# Patient Record
Sex: Male | Born: 1950 | Race: Black or African American | Hispanic: No | Marital: Married | Smoking: Former smoker
Health system: Southern US, Community
[De-identification: ages and names within clinical notes are randomized; demographics above are authoritative.]

## PROBLEM LIST (undated history)

## (undated) ENCOUNTER — Emergency Department (HOSPITAL_BASED_OUTPATIENT_CLINIC_OR_DEPARTMENT_OTHER): Admission: EM | Disposition: A | Discharge status: Home / Self Care | Payer: Medicare HMO | Source: Home / Self Care

## (undated) DIAGNOSIS — J302 Other seasonal allergic rhinitis: Secondary | ICD-10-CM

## (undated) DIAGNOSIS — E119 Type 2 diabetes mellitus without complications: Secondary | ICD-10-CM

## (undated) DIAGNOSIS — I10 Essential (primary) hypertension: Secondary | ICD-10-CM

## (undated) HISTORY — PX: BACK SURGERY: SHX140

---

## 1998-12-06 ENCOUNTER — Encounter: Payer: Self-pay | Admitting: Neurosurgery

## 1998-12-07 ENCOUNTER — Encounter: Payer: Self-pay | Admitting: Neurosurgery

## 1998-12-07 ENCOUNTER — Inpatient Hospital Stay (HOSPITAL_COMMUNITY): Admission: RE | Admit: 1998-12-07 | Discharge: 1998-12-08 | Payer: Self-pay | Admitting: Neurosurgery

## 2011-08-01 DEATH — deceased

## 2016-11-28 ENCOUNTER — Emergency Department
Admission: EM | Admit: 2016-11-28 | Discharge: 2016-11-28 | Disposition: A | Discharge status: Home / Self Care | Payer: Worker's Compensation | Source: Home / Self Care | Attending: Family Medicine | Admitting: Family Medicine

## 2016-11-28 ENCOUNTER — Encounter: Payer: Self-pay | Admitting: Emergency Medicine

## 2016-11-28 DIAGNOSIS — S62639A Displaced fracture of distal phalanx of unspecified finger, initial encounter for closed fracture: Secondary | ICD-10-CM

## 2016-11-28 DIAGNOSIS — S6710XD Crushing injury of unspecified finger(s), subsequent encounter: Secondary | ICD-10-CM

## 2016-11-28 DIAGNOSIS — S61312D Laceration without foreign body of right middle finger with damage to nail, subsequent encounter: Secondary | ICD-10-CM

## 2016-11-28 HISTORY — DX: Type 2 diabetes mellitus without complications: E11.9

## 2016-11-28 MED ORDER — OXYCODONE-ACETAMINOPHEN 5-325 MG PO TABS: 1.0000 | ORAL_TABLET | Freq: Four times a day (QID) | ORAL | 0 refills | Status: DC | PRN

## 2016-11-28 NOTE — ED Triage Notes (Signed)
Right 3rd finger crush injury 3 days ago. He is concerned that it is still bleeding and still hurts. Also the medication is making him nauseated, but he is unsure which one.

## 2016-11-28 NOTE — ED Provider Notes (Signed)
[facility]    CSN: [id number] Arrival date & time: 11/28/16  0850     History   Chief Complaint Chief Complaint  Patient presents with  . Finger Injury    HPI Hayden Kemp is a 66 y.o. male.   Patient suffered a crush injury to his right third fingertip 3 days ago.  He was [facility] where X-rays revealed a comminuted fracture of the right distal phalanx with intra-articular extension.  A laceration at the base of the fingernail was repaired with sutures.  He complains of persistent pain in the finger and difficulty performing his job.  He is also having nausea from one of his prescribed medications (Keflex 500mg  QID, and Norco Q6hr prn pain)   The history is provided by the patient.    Past Medical History:  Diagnosis Date  . Diabetes mellitus without complication (HCC)     There are no active problems to display for this patient.   Past Surgical History:  Procedure Laterality Date  . BACK SURGERY         Home Medications    Prior to Admission medications   Medication Sig Start Date End Date Taking? Authorizing Provider  ciprofloxacin (CIPRO) 500 MG tablet Take 500 mg by mouth 2 (two) times daily.   Yes Historical Provider, MD  oxyCODONE-acetaminophen (ROXICET) 5-325 MG tablet Take 1 tablet by mouth every 6 (six) hours as needed for severe pain. 11/28/16   Lattie HawStephen A Marysue Fait, MD    Family History No family history on file.  Social History Social History  Substance Use Topics  . Smoking status: Former Games developermoker  . Smokeless tobacco: Never Used  . Alcohol use Yes     Allergies   Patient has no known allergies.   Review of Systems Review of Systems  Constitutional: Negative for appetite change, chills, diaphoresis, fatigue and fever.  Gastrointestinal: Positive for nausea. Negative for abdominal pain and vomiting.  Musculoskeletal:       Pain in right third fingertip     Physical  Exam Triage Vital Signs ED Triage Vitals  Enc Vitals Group     BP 11/28/16 0952 138/86     Pulse Rate 11/28/16 0952 79     Resp --      Temp 11/28/16 0952 97.7 F (36.5 C)     Temp Source 11/28/16 0952 Oral     SpO2 11/28/16 0952 97 %     Weight 11/28/16 0953 250 lb (113.4 kg)     Height 11/28/16 0953 6' (1.829 m)     Head Circumference --      Peak Flow --      Pain Score 11/28/16 0955 8     Pain Loc --      Pain Edu? --      Excl. in GC? --    No data found.   Updated Vital Signs BP 138/86 (BP Location: Left Arm)   Pulse 79   Temp 97.7 F (36.5 C) (Oral)   Ht 6' (1.829 m)   Wt 250 lb (113.4 kg)   SpO2 97%   BMI 33.91 kg/m   Visual Acuity Right Eye Distance:   Left Eye Distance:   Bilateral Distance:    Right Eye Near:   Left Eye Near:    Bilateral Near:     Physical Exam  Constitutional: He appears well-developed and well-nourished. No distress.  HENT:  Head: Normocephalic.  Eyes: Pupils are equal,  round, and reactive to light.  Pulmonary/Chest: Effort normal.  Musculoskeletal:       Hands: Right third fingertip distal phalanx mildly swollen with tenderness to palpation at DIP joint.  No erythema or warmth. Decreased range of motion DIP joint.  Sutures in place at base of nail.  Small amount subungual hematoma present.  Distal cap refill present.  No evidence cellulitis.  Neurological: He is alert.  Skin: Skin is warm and dry.  Nursing note and vitals reviewed.    UC Treatments / Results  Labs (all labs ordered are listed, but only abnormal results are displayed) Labs Reviewed - No data to display  EKG  EKG Interpretation None       Radiology No results found.  Procedures Procedures (including critical care time)  Medications Ordered in UC Medications - No data to display   Initial Impression / Assessment and Plan / UC Course  I have reviewed the triage vital signs and the nursing notes.  Pertinent labs & imaging results that were  available during my care of the patient were reviewed by me and considered in my medical decision making (see chart for details).  Clinical Course   There is no evidence of bacterial infection today.  Wound redressed with Bacitracin and non-stick gauze.  Applied stack splint. Rx for Percocet. Continue finger splint.  Change dressing daily and apply Bacitracin ointment to wound.  Keep wound clean and dry.  Return for any signs of infection (or follow-up with family doctor):  Increasing redness, swelling, pain, heat, drainage, etc. Decrease Keflex 500mg  to every 8 hours.  Stop Norco.  May take Ibuprofen 200mg , 3 or 4 tabs every 8 hours with food.   Refer to orthopedist (note fracture distal phalanx with intra-articular extension).  Work restrictions:  Avoid use of right hand (light duty) until released by orthopedist.     Final Clinical Impressions(s) / UC Diagnoses   Final diagnoses:  Crush injury to finger, subsequent encounter  Fracture of distal phalanx of finger of right hand  Laceration of right middle finger without foreign body with damage to nail, subsequent encounter    New Prescriptions New Prescriptions   OXYCODONE-ACETAMINOPHEN (ROXICET) 5-325 MG TABLET    Take 1 tablet by mouth every 6 (six) hours as needed for severe pain.     Lattie HawStephen A Valaree Fresquez, MD 12/06/16 [phone]

## 2016-11-28 NOTE — Discharge Instructions (Signed)
Continue finger splint.  Change dressing daily and apply Bacitracin ointment to wound.  Keep wound clean and dry.  Return for any signs of infection (or follow-up with family doctor):  Increasing redness, swelling, pain, heat, drainage, etc. Continue Keflex 500mg  every 8 hours.  Stop Norco.  May take Ibuprofen 200mg , 3 or 4 tabs every 8 hours with food.

## 2016-12-03 ENCOUNTER — Encounter (HOSPITAL_BASED_OUTPATIENT_CLINIC_OR_DEPARTMENT_OTHER): Payer: Self-pay | Admitting: *Deleted

## 2016-12-03 ENCOUNTER — Other Ambulatory Visit: Payer: Self-pay | Admitting: Orthopedic Surgery

## 2016-12-03 NOTE — H&P (Signed)
Roger ShelterJames A Fossum is an 66 y.o. male.   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  [facility] and then in employee health.  In the emergency department he was x-rayed and splinted, and some kind of repair was performed involving the nail complex.  He was placed on Keflex as well as ibuprofen and Percocet.  Patient reports that he is just about out of pain medicine.  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  Past Medical History:  Diagnosis Date  . Diabetes mellitus without complication (HCC)   . Hypertension   . Seasonal allergies     Past Surgical History:  Procedure Laterality Date  . BACK SURGERY      History reviewed. No pertinent family history. Social History:  reports that he has quit smoking. He has never used smokeless tobacco. He reports that he drinks alcohol. He reports that he does not use drugs.  Allergies: No Known Allergies  No prescriptions prior to admission.    No results found for this or any previous visit (from the past 48 hour(s)). No results found.  Review of Systems  All other systems reviewed and are negative.   Height 6' (1.829 m), weight 117 kg (258 lb). Physical Exam  Constitutional:  WD, WN, NAD HEENT:  NCAT, EOMI Neuro/Psych:  Alert & oriented to person, place, and time; appropriate mood & affect Lymphatic: No generalized UE edema or lymphadenopathy Extremities / MSK:  Both UE are normal with respect to appearance, ranges of motion, joint stability, muscle strength/tone, sensation, & perfusion except as otherwise noted:  The right long fingertip has a laceration at the eponychial fold.  The digit has approximately 3 sutures through the nail and potentially into the nail bed which do not appear to  be infected.  There is definite discoloration and what appears to be a subungual hematoma.  The underlying nailbed cannot be visualized. The skin about the distal phalanx is also macerated as the patient has been applying ointment.  The PIP and MP are able to flex fully, but the digit itself is quite swollen.  Light touch sensibility is intact although somewhat altered at the digital fingertip.  Labs / Xrays:  3 views of the right long finger were ordered and obtained today and demonstrate a very comminuted,  displaced, intra-articular, distal phalanx fracture.  Assessment: Right long finger crush injury with a comminuted, displaced distal phalanx fracture with intra-articular extension and unclear degree of injury to the nail bed as the nail plate has been replaced/repaired  Plan:  The findings are discussed with the patient as well as his nurse case manager.  The advantages and disadvantages of continued nonoperative care versus surgical care were discussed.  The patient would like to move forward with surgical treatment and will do so tomorrow afternoon, removing the nail plate, exploring and repairing the nail complex as necessary, and hopefully improving upon the alignment/stability of the distal phalangeal fracture fragments to some degree. The details of the operative procedure were discussed with the patient.  Questions were invited and answered.  In addition to the goal of the procedure, the risks of the procedure to include but not limited to bleeding; infection; damage to the nerves or blood vessels that could  result in bleeding, numbness, weakness, chronic pain, and the need for additional procedures; stiffness; the need for revision surgery; and anesthetic risks were reviewed.  No specific outcome was guaranteed or implied.  Informed consent was obtained.   Manilla Strieter A., MD 12/03/2016, 7:28 PM

## 2016-12-04 ENCOUNTER — Encounter (HOSPITAL_BASED_OUTPATIENT_CLINIC_OR_DEPARTMENT_OTHER): Payer: Self-pay | Admitting: *Deleted

## 2016-12-04 ENCOUNTER — Ambulatory Visit (HOSPITAL_BASED_OUTPATIENT_CLINIC_OR_DEPARTMENT_OTHER): Payer: Worker's Compensation | Admitting: Anesthesiology

## 2016-12-04 ENCOUNTER — Encounter (HOSPITAL_BASED_OUTPATIENT_CLINIC_OR_DEPARTMENT_OTHER)
Admission: RE | Disposition: A | Discharge status: Home / Self Care | Payer: Self-pay | Source: Ambulatory Visit | Attending: Orthopedic Surgery

## 2016-12-04 ENCOUNTER — Ambulatory Visit (HOSPITAL_COMMUNITY): Payer: Worker's Compensation

## 2016-12-04 ENCOUNTER — Ambulatory Visit (HOSPITAL_BASED_OUTPATIENT_CLINIC_OR_DEPARTMENT_OTHER)
Admission: RE | Admit: 2016-12-04 | Discharge: 2016-12-04 | Disposition: A | Discharge status: Home / Self Care | Payer: Worker's Compensation | Source: Ambulatory Visit | Attending: Orthopedic Surgery | Admitting: Orthopedic Surgery

## 2016-12-04 DIAGNOSIS — S67192A Crushing injury of right middle finger, initial encounter: Secondary | ICD-10-CM | POA: Diagnosis present

## 2016-12-04 DIAGNOSIS — W230XXA Caught, crushed, jammed, or pinched between moving objects, initial encounter: Secondary | ICD-10-CM | POA: Diagnosis not present

## 2016-12-04 DIAGNOSIS — S62632B Displaced fracture of distal phalanx of right middle finger, initial encounter for open fracture: Secondary | ICD-10-CM | POA: Diagnosis not present

## 2016-12-04 DIAGNOSIS — Y9289 Other specified places as the place of occurrence of the external cause: Secondary | ICD-10-CM | POA: Diagnosis not present

## 2016-12-04 DIAGNOSIS — Z87891 Personal history of nicotine dependence: Secondary | ICD-10-CM | POA: Insufficient documentation

## 2016-12-04 DIAGNOSIS — E119 Type 2 diabetes mellitus without complications: Secondary | ICD-10-CM | POA: Insufficient documentation

## 2016-12-04 DIAGNOSIS — Z419 Encounter for procedure for purposes other than remedying health state, unspecified: Secondary | ICD-10-CM

## 2016-12-04 DIAGNOSIS — I10 Essential (primary) hypertension: Secondary | ICD-10-CM | POA: Diagnosis not present

## 2016-12-04 HISTORY — PX: NAILBED REPAIR: SHX5028

## 2016-12-04 HISTORY — PX: OPEN REDUCTION INTERNAL FIXATION (ORIF) DISTAL PHALANX: SHX6236

## 2016-12-04 HISTORY — DX: Other seasonal allergic rhinitis: J30.2

## 2016-12-04 HISTORY — DX: Essential (primary) hypertension: I10

## 2016-12-04 LAB — POCT I-STAT, CHEM 8
BUN: 17 mg/dL (ref 6–20)
CALCIUM ION: 1.21 mmol/L (ref 1.15–1.40)
CHLORIDE: 103 mmol/L (ref 101–111)
CREATININE: 1 mg/dL (ref 0.61–1.24)
Glucose, Bld: 149 mg/dL — ABNORMAL HIGH (ref 65–99)
HCT: 50 % (ref 39.0–52.0)
Hemoglobin: 17 g/dL (ref 13.0–17.0)
Potassium: 4.4 mmol/L (ref 3.5–5.1)
Sodium: 140 mmol/L (ref 135–145)
TCO2: 26 mmol/L (ref 0–100)

## 2016-12-04 LAB — GLUCOSE, CAPILLARY: Glucose-Capillary: 119 mg/dL — ABNORMAL HIGH (ref 65–99)

## 2016-12-04 SURGERY — OPEN REDUCTION INTERNAL FIXATION (ORIF) DISTAL PHALANX
Anesthesia: Monitor Anesthesia Care | Site: Finger | Laterality: Right

## 2016-12-04 MED ORDER — BUPIVACAINE-EPINEPHRINE 0.25% -1:200000 IJ SOLN
INTRAMUSCULAR | Status: DC | PRN
  Administered 2016-12-04: 5 mL

## 2016-12-04 MED ORDER — SCOPOLAMINE 1 MG/3DAYS TD PT72: 1.0000 | MEDICATED_PATCH | Freq: Once | TRANSDERMAL | Status: DC | PRN

## 2016-12-04 MED ORDER — LACTATED RINGERS IV SOLN
INTRAVENOUS | Status: DC
  Administered 2016-12-04: 15:00:00 via INTRAVENOUS

## 2016-12-04 MED ORDER — CEFAZOLIN SODIUM-DEXTROSE 2-4 GM/100ML-% IV SOLN
2.0000 g | INTRAVENOUS | Status: AC
  Administered 2016-12-04: 2 g via INTRAVENOUS

## 2016-12-04 MED ORDER — FENTANYL CITRATE (PF) 100 MCG/2ML IJ SOLN
50.0000 ug | INTRAMUSCULAR | Status: DC | PRN
  Administered 2016-12-04 (×2): 50 ug via INTRAVENOUS

## 2016-12-04 MED ORDER — ONDANSETRON HCL 4 MG/2ML IJ SOLN
INTRAMUSCULAR | Status: DC | PRN
  Administered 2016-12-04: 4 mg via INTRAVENOUS

## 2016-12-04 MED ORDER — LIDOCAINE 2% (20 MG/ML) 5 ML SYRINGE
INTRAMUSCULAR | Status: AC
  Filled 2016-12-04: qty 5

## 2016-12-04 MED ORDER — MIDAZOLAM HCL 2 MG/2ML IJ SOLN
1.0000 mg | INTRAMUSCULAR | Status: DC | PRN
  Administered 2016-12-04: 1 mg via INTRAVENOUS

## 2016-12-04 MED ORDER — PROPOFOL 10 MG/ML IV BOLUS
INTRAVENOUS | Status: AC
  Filled 2016-12-04: qty 20

## 2016-12-04 MED ORDER — ONDANSETRON HCL 4 MG/2ML IJ SOLN
INTRAMUSCULAR | Status: AC
  Filled 2016-12-04: qty 2

## 2016-12-04 MED ORDER — PROPOFOL 500 MG/50ML IV EMUL
INTRAVENOUS | Status: DC | PRN
  Administered 2016-12-04: 100 ug/kg/min via INTRAVENOUS

## 2016-12-04 MED ORDER — LACTATED RINGERS IV SOLN
INTRAVENOUS | Status: DC
  Administered 2016-12-04: 14:00:00 via INTRAVENOUS

## 2016-12-04 MED ORDER — MIDAZOLAM HCL 2 MG/2ML IJ SOLN
INTRAMUSCULAR | Status: AC
  Filled 2016-12-04: qty 2

## 2016-12-04 MED ORDER — BUPIVACAINE-EPINEPHRINE 0.5% -1:200000 IJ SOLN: INTRAMUSCULAR | Status: DC | PRN

## 2016-12-04 MED ORDER — FENTANYL CITRATE (PF) 100 MCG/2ML IJ SOLN
INTRAMUSCULAR | Status: AC
  Filled 2016-12-04: qty 2

## 2016-12-04 MED ORDER — LIDOCAINE HCL (PF) 1 % IJ SOLN
INTRAMUSCULAR | Status: DC | PRN
  Administered 2016-12-04: 5 mL

## 2016-12-04 MED ORDER — CEFAZOLIN SODIUM-DEXTROSE 2-4 GM/100ML-% IV SOLN
INTRAVENOUS | Status: AC
Start: 2016-12-04 — End: 2016-12-04
  Filled 2016-12-04: qty 100

## 2016-12-04 SURGICAL SUPPLY — 50 items
BANDAGE COBAN STERILE 2 (GAUZE/BANDAGES/DRESSINGS) IMPLANT
BLADE MINI RND TIP GREEN BEAV (BLADE) IMPLANT
BLADE SURG 15 STRL LF DISP TIS (BLADE) ×1 IMPLANT
BLADE SURG 15 STRL SS (BLADE) ×2
BNDG COHESIVE 1X5 TAN STRL LF (GAUZE/BANDAGES/DRESSINGS) IMPLANT
BNDG COHESIVE 4X5 TAN STRL (GAUZE/BANDAGES/DRESSINGS) ×3 IMPLANT
BNDG CONFORM 2 STRL LF (GAUZE/BANDAGES/DRESSINGS) IMPLANT
BNDG ESMARK 4X9 LF (GAUZE/BANDAGES/DRESSINGS) ×3 IMPLANT
BNDG GAUZE ELAST 4 BULKY (GAUZE/BANDAGES/DRESSINGS) ×3 IMPLANT
CHLORAPREP W/TINT 26ML (MISCELLANEOUS) ×3 IMPLANT
CORDS BIPOLAR (ELECTRODE) ×3 IMPLANT
COVER BACK TABLE 60X90IN (DRAPES) ×3 IMPLANT
COVER MAYO STAND STRL (DRAPES) ×3 IMPLANT
CUFF TOURNIQUET SINGLE 18IN (TOURNIQUET CUFF) ×3 IMPLANT
DEPRESSOR TONGUE BLADE STERILE (MISCELLANEOUS) IMPLANT
DRAPE C-ARM 42X72 X-RAY (DRAPES) ×3 IMPLANT
DRAPE EXTREMITY T 121X128X90 (DRAPE) ×3 IMPLANT
DRAPE SURG 17X23 STRL (DRAPES) ×3 IMPLANT
DRSG EMULSION OIL 3X3 NADH (GAUZE/BANDAGES/DRESSINGS) ×3 IMPLANT
GLOVE BIO SURGEON STRL SZ 6.5 (GLOVE) ×2 IMPLANT
GLOVE BIO SURGEON STRL SZ7.5 (GLOVE) ×3 IMPLANT
GLOVE BIO SURGEONS STRL SZ 6.5 (GLOVE) ×1
GLOVE BIOGEL PI IND STRL 7.0 (GLOVE) ×2 IMPLANT
GLOVE BIOGEL PI IND STRL 8 (GLOVE) ×1 IMPLANT
GLOVE BIOGEL PI INDICATOR 7.0 (GLOVE) ×4
GLOVE BIOGEL PI INDICATOR 8 (GLOVE) ×2
GLOVE ECLIPSE 6.5 STRL STRAW (GLOVE) ×3 IMPLANT
GOWN STRL REUS W/ TWL LRG LVL3 (GOWN DISPOSABLE) ×2 IMPLANT
GOWN STRL REUS W/TWL LRG LVL3 (GOWN DISPOSABLE) ×4
GOWN STRL REUS W/TWL XL LVL3 (GOWN DISPOSABLE) ×3 IMPLANT
K-WIRE .035X4 (WIRE) ×3 IMPLANT
K-WIRE .045X4 (WIRE) ×6 IMPLANT
NEEDLE HYPO 22GX1.5 SAFETY (NEEDLE) IMPLANT
NS IRRIG 1000ML POUR BTL (IV SOLUTION) ×3 IMPLANT
PACK BASIN DAY SURGERY FS (CUSTOM PROCEDURE TRAY) ×3 IMPLANT
PADDING CAST ABS 4INX4YD NS (CAST SUPPLIES)
PADDING CAST ABS COTTON 4X4 ST (CAST SUPPLIES) IMPLANT
RUBBERBAND STERILE (MISCELLANEOUS) IMPLANT
SPONGE GAUZE 4X4 12PLY STER LF (GAUZE/BANDAGES/DRESSINGS) ×3 IMPLANT
STOCKINETTE 6  STRL (DRAPES) ×2
STOCKINETTE 6 STRL (DRAPES) ×1 IMPLANT
SUT CHROMIC 6 0 PS 4 (SUTURE) ×3 IMPLANT
SUT ETHILON 4 0 PS 2 18 (SUTURE) ×3 IMPLANT
SUT VICRYL RAPIDE 4-0 (SUTURE) IMPLANT
SUT VICRYL RAPIDE 4/0 PS 2 (SUTURE) IMPLANT
SYR 10ML LL (SYRINGE) IMPLANT
SYR BULB 3OZ (MISCELLANEOUS) IMPLANT
TOWEL OR 17X24 6PK STRL BLUE (TOWEL DISPOSABLE) ×3 IMPLANT
TOWEL OR NON WOVEN STRL DISP B (DISPOSABLE) ×3 IMPLANT
UNDERPAD 30X30 (UNDERPADS AND DIAPERS) ×3 IMPLANT

## 2016-12-04 NOTE — Discharge Instructions (Signed)
Discharge Instructions   You have a dressing with a plaster splint incorporated in it. Move your fingers as much as possible, making a full fist and fully opening the fist. Elevate your hand above your elbow to reduce pain & swelling of the digits.  Ice over the operative site and/or the arm pit may be helpful to reduce pain & swelling.  DO NOT USE HEAT. Pain medicine has been prescribed for you.  Take the meloxicam as it states on the bottle. You may add Tylenol and continue this also as it states on the bottle. The pain medication is for severe break through pain. Leave the dressing in place until you return to our office.  You may shower, but keep the bandage clean & dry.  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 Our office will contact you in 7-10 days for follow up appointment. Out of work until the return appointment in 7-10 days.   Please call [phone] during normal business hours or [phone] after hours for any problems. Including the following:  - excessive redness of the incisions - drainage for more than 4 days - fever of more than 101.5 F  *Please note that pain medications will not be refilled after hours or on weekends.    Post Anesthesia Home Care Instructions  Activity: Get plenty of rest for the remainder of the day. A responsible adult should stay with you for 24 hours following the procedure.  For the next 24 hours, DO NOT: -Drive a car -Advertising copywriterperate machinery -Drink alcoholic beverages -Take any medication unless instructed by your physician -Make any legal decisions or sign important papers.  Meals: Start with liquid foods such as gelatin or soup. Progress to regular foods as tolerated. Avoid greasy, spicy, heavy foods. If nausea and/or vomiting occur, drink only clear liquids until the nausea and/or vomiting subsides. Call your physician if vomiting continues.  Special  Instructions/Symptoms: Your throat may feel dry or sore from the anesthesia or the breathing tube placed in your throat during surgery. If this causes discomfort, gargle with warm salt water. The discomfort should disappear within 24 hours.  If you had a scopolamine patch placed behind your ear for the management of post- operative nausea and/or vomiting:  1. The medication in the patch is effective for 72 hours, after which it should be removed.  Wrap patch in a tissue and discard in the trash. Wash hands thoroughly with soap and water. 2. You may remove the patch earlier than 72 hours if you experience unpleasant side effects which may include dry mouth, dizziness or visual disturbances. 3. Avoid touching the patch. Wash your hands with soap and water after contact with the patch.

## 2016-12-04 NOTE — Anesthesia Postprocedure Evaluation (Signed)
Anesthesia Post Note  Patient: Roger ShelterJames A Quigley  Procedure(s) Performed: Procedure(s) (LRB): OPEN TREATMENT OF RIGHT LONG FINGER  DISTAL PHALANX FRACTURE AND NAILBED INJURY (Right) NAILBED REPAIR (Right)  Patient location during evaluation: PACU Anesthesia Type: MAC Level of consciousness: awake and alert Pain management: pain level controlled Vital Signs Assessment: post-procedure vital signs reviewed and stable Respiratory status: spontaneous breathing, nonlabored ventilation, respiratory function stable and patient connected to nasal cannula oxygen Cardiovascular status: stable and blood pressure returned to baseline Anesthetic complications: no    Last Vitals:  Vitals:   12/04/16 1341 12/04/16 1615  BP: (!) 159/84   Pulse: 70 66  Resp: 18 (!) 9  Temp: 36.6 C 36.6 C    Last Pain:  Vitals:   12/04/16 1615  TempSrc:   PainSc: 0-No pain                 Ziggy Reveles DAVID

## 2016-12-04 NOTE — Interval H&P Note (Signed)
History and Physical Interval Note:  12/04/2016 2:23 PM  Hayden Kemp  has presented today for surgery, with the diagnosis of RIGHT LONG FINGER DISTAL PHALANX FRACTURE AND NAILBED INJURY S62.632A  The various methods of treatment have been discussed with the patient and family. After consideration of risks, benefits and other options for treatment, the patient has consented to  Procedure(s): OPEN TREATMENT OF RIGHT LONG FINGER  DISTAL PHALANX FRACTURE AND NAILBED INJURY (Right) as a surgical intervention .  The patient's history has been reviewed, patient examined, no change in status, stable for surgery.  I have reviewed the patient's chart and labs.  Questions were answered to the patient's satisfaction.     Robertha Staples A.

## 2016-12-04 NOTE — Anesthesia Preprocedure Evaluation (Signed)
Anesthesia Evaluation  Patient identified by MRN, date of birth, ID band Patient awake    Reviewed: Allergy & Precautions, NPO status , Patient's Chart, lab work & pertinent test results  Airway Mallampati: I  TM Distance: >3 FB Neck ROM: Full    Dental   Pulmonary former smoker,    Pulmonary exam normal        Cardiovascular hypertension, Pt. on medications Normal cardiovascular exam     Neuro/Psych    GI/Hepatic   Endo/Other  diabetes, Type 2, Oral Hypoglycemic Agents  Renal/GU      Musculoskeletal   Abdominal   Peds  Hematology   Anesthesia Other Findings   Reproductive/Obstetrics                             Anesthesia Physical Anesthesia Plan  ASA: II  Anesthesia Plan: MAC   Post-op Pain Management:    Induction: Intravenous  Airway Management Planned: Simple Face Mask  Additional Equipment:   Intra-op Plan:   Post-operative Plan:   Informed Consent: I have reviewed the patients History and Physical, chart, labs and discussed the procedure including the risks, benefits and alternatives for the proposed anesthesia with the patient or authorized representative who has indicated his/her understanding and acceptance.     Plan Discussed with: CRNA and Surgeon  Anesthesia Plan Comments:         Anesthesia Quick Evaluation

## 2016-12-04 NOTE — Op Note (Signed)
12/04/2016  2:23 PM  PATIENT:  Hayden Kemp  66 y.o. male  PRE-OPERATIVE DIAGNOSIS:  Comminuted displaced right long finger distal phalanx fracture with suspected nailbed injury  POST-OPERATIVE DIAGNOSIS:  Same  PROCEDURE:  Excisional debridement of skin and subcutaneous tissue associated with open fracture, with ORIF right long finger P3 fracture with nail plate removal and nailbed repair  SURGEON: Cliffton Astersavid A. Janee Mornhompson, MD  PHYSICIAN ASSISTANT: Danielle RankinKirsten Schrader, OPA-C  ANESTHESIA:  local and MAC  SPECIMENS:  None  DRAINS:   None  EBL:  less than 50 mL  PREOPERATIVE INDICATIONS:  Hayden Kemp is a  66 y.o. male with crush injury to right long finger, causing a comminuted displaced P3 fracture with overlying nailbed injury  The risks benefits and alternatives were discussed with the patient preoperatively including but not limited to the risks of infection, bleeding, nerve injury, cardiopulmonary complications, the need for revision surgery, among others, and the patient verbalized understanding and consented to proceed.  OPERATIVE IMPLANTS: 0.045 inch K wires 2, 0.035 inch K wires 1  OPERATIVE PROCEDURE:  After receiving prophylactic antibiotics, the patient was escorted to the operative theatre and placed in a supine position.  A surgical "time-out" was performed during which the planned procedure, proposed operative site, and the correct patient identity were compared to the operative consent and agreement confirmed by the circulating nurse according to current facility policy.  He was sedated and I instilled a digital block with a mixture of lidocaine and Marcaine bearing epinephrine.  Following application of a tourniquet to the operative extremity, the exposed skin was pre-scrub with a Hibiclens scrub brush before being formally prepped with Chloraprep and draped in the usual sterile fashion.  The limb was exsanguinated with an Esmarch bandage and the tourniquet inflated to  approximately 100mmHg higher than systolic BP.  The  nail plate was removed by separating it from the areas where it was still attached nailbed with a Therapist, nutritionalreer elevator and hemostat.  Once the nail plate was removed, it could be seen that there was a complete transverse disruption of the nailbed right at the edge of the eponychial fold.  The eponychial fold was somewhat devitalized and so skin and subcutaneous tissues were excisionally debrided with scissor dissection.  The devitalized portions of nailbed were excised with sharp dissection using a scalpel and forceps.  The open fracture was then irrigated copiously.  Once satisfied with the excision of devitalized tissue and irrigation of fracture, fixation commenced.  Manipulate fragments with visualization and direct fragment manipulation using my fingers, the fracture fragments were better aligned and secured with 2 crossed longitudinal 0.045 inch K wires and one distal transverse or 0.035 inch K wire.  The longitudinal K wires crossed the DIP joint.  Alignment of the fracture fragments was much improved.  There was still some displacement in the dorsal cortical shell piece at the proximal diametaphyseal junction is it was a loose fragment.  After final images, I did elevated some and then compressed longitudinally to help secured in a more dorsally located position.  These were subsequently bent over at the skin and clipped.  The nailbed was repaired with 6-0 chromic suture in interrupted fashion.  Xeroform was placed on the nailbed and also in the nail fold.  Tourniquet was released, the digit was dressed with a volar tongue blade component still allow the MP and PIP to flex.  He was taken to recovery room stable condition  DISPOSITION: He will be discharged home today,  returning in 7-10 days with follow-on hand therapy appointment for custom splint fabrication and initiation of motion exercises.

## 2016-12-04 NOTE — Transfer of Care (Signed)
Immediate Anesthesia Transfer of Care Note  Patient: Hayden Kemp  Procedure(s) Performed: Procedure(s): OPEN TREATMENT OF RIGHT LONG FINGER  DISTAL PHALANX FRACTURE AND NAILBED INJURY (Right) NAILBED REPAIR (Right)  Patient Location: PACU  Anesthesia Type:MAC  Level of Consciousness: awake and sedated  Airway & Oxygen Therapy: Patient Spontanous Breathing  Post-op Assessment: Report given to RN and Post -op Vital signs reviewed and stable  Post vital signs: Reviewed and stable  Last Vitals:  Vitals:   12/04/16 1341  BP: (!) 159/84  Pulse: 70  Resp: 18  Temp: 36.6 C    Last Pain:  Vitals:   12/04/16 1341  TempSrc: Oral  PainSc: 3       Patients Stated Pain Goal: 1 (12/04/16 1341)  Complications: No apparent anesthesia complications

## 2016-12-05 ENCOUNTER — Encounter (HOSPITAL_BASED_OUTPATIENT_CLINIC_OR_DEPARTMENT_OTHER): Payer: Self-pay | Admitting: Orthopedic Surgery

## 2017-04-22 ENCOUNTER — Other Ambulatory Visit: Payer: Self-pay | Admitting: Orthopedic Surgery

## 2017-04-24 ENCOUNTER — Encounter (HOSPITAL_BASED_OUTPATIENT_CLINIC_OR_DEPARTMENT_OTHER): Payer: Self-pay | Admitting: *Deleted

## 2017-04-24 NOTE — H&P (Signed)
Hayden Kemp is an 67 y.o. male.   CC / Reason for Visit: Right long finger follow-up HPI: This patient returns for reevaluation of his right long finger.  He reports that he continues to use the Silipos device and a rigid splint intermittently.  He still has pain with some gripping and grasping activities, more so when pressure is applied to the base of the distal phalanx volarly than at the very tip.  He isn't really bothered much by the gross instability through the distal phalanx  Past Medical History:  Diagnosis Date  . Diabetes mellitus without complication (HCC)   . Hypertension   . Seasonal allergies     Past Surgical History:  Procedure Laterality Date  . BACK SURGERY    . NAILBED REPAIR Right 12/04/2016   Procedure: NAILBED REPAIR;  Surgeon: Mack Hook, MD;  Location: [facility];  Service: Orthopedics;  Laterality: Right;  . OPEN REDUCTION INTERNAL FIXATION (ORIF) DISTAL PHALANX Right 12/04/2016   Procedure: OPEN TREATMENT OF RIGHT LONG FINGER  DISTAL PHALANX FRACTURE AND NAILBED INJURY;  Surgeon: Mack Hook, MD;  Location: [facility];  Service: Orthopedics;  Laterality: Right;    History reviewed. No pertinent family history. Social History:  reports that he has quit smoking. He has never used smokeless tobacco. He reports that he drinks alcohol. He reports that he does not use drugs.  Allergies: No Known Allergies  No prescriptions prior to admission.    No results found for this or any previous visit (from the past 48 hour(s)). No results found.  Review of Systems  All other systems reviewed and are negative.   Height 6' (1.829 m), weight 115.7 kg (255 lb). Physical Exam  Constitutional:  WD, WN, NAD HEENT:  NCAT, EOMI Neuro/Psych:  Alert & oriented to person, place, and time; appropriate mood & affect Lymphatic: No generalized UE edema or lymphadenopathy Extremities / MSK:  Both UE are normal with respect to appearance,  ranges of motion, joint stability, muscle strength/tone, sensation, & perfusion except as otherwise noted:  There is now approximately 9 mm of new relatively normal-looking nail growth distal to the eponychial fold.  AROM: MP 0-85, PIP 10-100, DIP 0-15.  There is gross appreciable instability through the fracture at the midportion of the distal phalanx.  There is also a prominence of the bone and the pulp, posterior to the DIP flexion crease than the tip of the digit, and this is most symptomatic for him.  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  I presented options that included continued observation with no further invasive interventions on one end and amputation at the other.  I also presented an intermediary approach given that so much of the end of this digit is good, including volar sensibility, lack of significant pain at the DIP joint, healed dorsal structures including the nail complex, and intact flexor and extensor tendons.  With this approach, we discussed working to ensure that the volar spike of bone is not prominent and inhibiting pinch at the pulp, and at the same time attempting to provide  stability through the distal phalanx, likely with bone grafting from the distal radius.  After careful consideration and deliberation, he indicated that he would like to proceed with the intermediary approach and understands the pros/cons.  We will plan to proceed once authorization is obtained. The details of the operative procedure were discussed with the patient.  Questions were invited  and answered.  In addition to the goal of the procedure, the risks of the procedure to include but not limited to bleeding; infection; damage to the nerves or blood vessels that could result in bleeding, numbness, weakness, chronic pain, and the need for additional procedures; stiffness; the need for revision surgery; and anesthetic risks were reviewed.  No specific outcome was guaranteed or implied.  Informed consent was obtained.  In the interim, we will hold physical therapy  Cambria Osten A., MD 04/24/2017, 4:25 PM

## 2017-04-26 ENCOUNTER — Encounter (HOSPITAL_BASED_OUTPATIENT_CLINIC_OR_DEPARTMENT_OTHER)
Admission: RE | Admit: 2017-04-26 | Discharge: 2017-04-26 | Disposition: A | Discharge status: Home / Self Care | Payer: Worker's Compensation | Source: Ambulatory Visit | Attending: Orthopedic Surgery | Admitting: Orthopedic Surgery

## 2017-04-26 DIAGNOSIS — E119 Type 2 diabetes mellitus without complications: Secondary | ICD-10-CM | POA: Diagnosis not present

## 2017-04-26 DIAGNOSIS — Y939 Activity, unspecified: Secondary | ICD-10-CM | POA: Diagnosis not present

## 2017-04-26 DIAGNOSIS — I10 Essential (primary) hypertension: Secondary | ICD-10-CM | POA: Diagnosis not present

## 2017-04-26 DIAGNOSIS — S62632A Displaced fracture of distal phalanx of right middle finger, initial encounter for closed fracture: Secondary | ICD-10-CM | POA: Diagnosis present

## 2017-04-26 DIAGNOSIS — Z87891 Personal history of nicotine dependence: Secondary | ICD-10-CM | POA: Diagnosis not present

## 2017-04-26 DIAGNOSIS — X58XXXA Exposure to other specified factors, initial encounter: Secondary | ICD-10-CM | POA: Diagnosis not present

## 2017-04-26 LAB — BASIC METABOLIC PANEL
ANION GAP: 6 (ref 5–15)
BUN: 20 mg/dL (ref 6–20)
CHLORIDE: 102 mmol/L (ref 101–111)
CO2: 27 mmol/L (ref 22–32)
Calcium: 9.3 mg/dL (ref 8.9–10.3)
Creatinine, Ser: 1.14 mg/dL (ref 0.61–1.24)
GFR calc Af Amer: >60 mL/min (ref >60)
GLUCOSE: 268 mg/dL — AB (ref 65–99)
POTASSIUM: 4.4 mmol/L (ref 3.5–5.1)
Sodium: 135 mmol/L (ref 135–145)

## 2017-04-29 ENCOUNTER — Encounter (HOSPITAL_BASED_OUTPATIENT_CLINIC_OR_DEPARTMENT_OTHER)
Admission: RE | Disposition: A | Discharge status: Home / Self Care | Payer: Self-pay | Source: Ambulatory Visit | Attending: Orthopedic Surgery

## 2017-04-29 ENCOUNTER — Ambulatory Visit (HOSPITAL_BASED_OUTPATIENT_CLINIC_OR_DEPARTMENT_OTHER)
Admission: RE | Admit: 2017-04-29 | Discharge: 2017-04-29 | Disposition: A | Discharge status: Home / Self Care | Payer: Worker's Compensation | Source: Ambulatory Visit | Attending: Orthopedic Surgery | Admitting: Orthopedic Surgery

## 2017-04-29 ENCOUNTER — Ambulatory Visit (HOSPITAL_COMMUNITY): Payer: Worker's Compensation

## 2017-04-29 ENCOUNTER — Ambulatory Visit (HOSPITAL_BASED_OUTPATIENT_CLINIC_OR_DEPARTMENT_OTHER): Payer: Worker's Compensation | Admitting: Certified Registered"

## 2017-04-29 ENCOUNTER — Encounter (HOSPITAL_BASED_OUTPATIENT_CLINIC_OR_DEPARTMENT_OTHER): Payer: Self-pay | Admitting: *Deleted

## 2017-04-29 DIAGNOSIS — E119 Type 2 diabetes mellitus without complications: Secondary | ICD-10-CM | POA: Insufficient documentation

## 2017-04-29 DIAGNOSIS — S62632A Displaced fracture of distal phalanx of right middle finger, initial encounter for closed fracture: Secondary | ICD-10-CM | POA: Diagnosis not present

## 2017-04-29 DIAGNOSIS — I10 Essential (primary) hypertension: Secondary | ICD-10-CM | POA: Diagnosis not present

## 2017-04-29 DIAGNOSIS — Z87891 Personal history of nicotine dependence: Secondary | ICD-10-CM | POA: Diagnosis not present

## 2017-04-29 DIAGNOSIS — Z419 Encounter for procedure for purposes other than remedying health state, unspecified: Secondary | ICD-10-CM

## 2017-04-29 DIAGNOSIS — X58XXXA Exposure to other specified factors, initial encounter: Secondary | ICD-10-CM | POA: Insufficient documentation

## 2017-04-29 DIAGNOSIS — Y939 Activity, unspecified: Secondary | ICD-10-CM | POA: Insufficient documentation

## 2017-04-29 HISTORY — PX: OPEN REDUCTION INTERNAL FIXATION (ORIF) FINGER WITH RADIAL BONE GRAFT: SHX5666

## 2017-04-29 LAB — GLUCOSE, CAPILLARY
GLUCOSE-CAPILLARY: 139 mg/dL — AB (ref 65–99)
GLUCOSE-CAPILLARY: 173 mg/dL — AB (ref 65–99)

## 2017-04-29 SURGERY — OPEN REDUCTION INTERNAL FIXATION (ORIF) FINGER WITH RADIAL BONE GRAFT
Anesthesia: General | Site: Hand | Laterality: Right

## 2017-04-29 MED ORDER — MIDAZOLAM HCL 2 MG/2ML IJ SOLN
INTRAMUSCULAR | Status: AC
  Filled 2017-04-29: qty 2

## 2017-04-29 MED ORDER — FENTANYL CITRATE (PF) 100 MCG/2ML IJ SOLN
50.0000 ug | INTRAMUSCULAR | Status: DC | PRN
  Administered 2017-04-29: 100 ug via INTRAVENOUS

## 2017-04-29 MED ORDER — LACTATED RINGERS IV SOLN: INTRAVENOUS | Status: DC

## 2017-04-29 MED ORDER — FENTANYL CITRATE (PF) 100 MCG/2ML IJ SOLN
INTRAMUSCULAR | Status: AC
  Filled 2017-04-29: qty 2

## 2017-04-29 MED ORDER — ONDANSETRON HCL 4 MG/2ML IJ SOLN
INTRAMUSCULAR | Status: DC | PRN
  Administered 2017-04-29: 4 mg via INTRAVENOUS

## 2017-04-29 MED ORDER — OXYCODONE HCL 5 MG PO TABS: 5.0000 mg | ORAL_TABLET | Freq: Four times a day (QID) | ORAL | 0 refills | Status: DC | PRN

## 2017-04-29 MED ORDER — ACETAMINOPHEN 325 MG PO TABS: 650.0000 mg | ORAL_TABLET | Freq: Four times a day (QID) | ORAL | Status: AC | PRN

## 2017-04-29 MED ORDER — FENTANYL CITRATE (PF) 100 MCG/2ML IJ SOLN: 25.0000 ug | INTRAMUSCULAR | Status: DC | PRN

## 2017-04-29 MED ORDER — BUPIVACAINE-EPINEPHRINE (PF) 0.5% -1:200000 IJ SOLN
INTRAMUSCULAR | Status: DC | PRN
  Administered 2017-04-29: 30 mL via PERINEURAL

## 2017-04-29 MED ORDER — MEPERIDINE HCL 25 MG/ML IJ SOLN: 6.2500 mg | INTRAMUSCULAR | Status: DC | PRN

## 2017-04-29 MED ORDER — PROPOFOL 10 MG/ML IV BOLUS
INTRAVENOUS | Status: DC | PRN
  Administered 2017-04-29: 200 mg via INTRAVENOUS

## 2017-04-29 MED ORDER — METOCLOPRAMIDE HCL 5 MG/ML IJ SOLN: 10.0000 mg | Freq: Once | INTRAMUSCULAR | Status: DC | PRN

## 2017-04-29 MED ORDER — MIDAZOLAM HCL 2 MG/2ML IJ SOLN
1.0000 mg | INTRAMUSCULAR | Status: DC | PRN
  Administered 2017-04-29: 2 mg via INTRAVENOUS

## 2017-04-29 MED ORDER — DEXAMETHASONE SODIUM PHOSPHATE 10 MG/ML IJ SOLN
INTRAMUSCULAR | Status: DC | PRN
  Administered 2017-04-29: 4 mg via INTRAVENOUS

## 2017-04-29 MED ORDER — LACTATED RINGERS IV SOLN
INTRAVENOUS | Status: DC
  Administered 2017-04-29 (×2): via INTRAVENOUS

## 2017-04-29 MED ORDER — SCOPOLAMINE 1 MG/3DAYS TD PT72: 1.0000 | MEDICATED_PATCH | Freq: Once | TRANSDERMAL | Status: DC | PRN

## 2017-04-29 MED ORDER — CEFAZOLIN SODIUM-DEXTROSE 2-4 GM/100ML-% IV SOLN
2.0000 g | INTRAVENOUS | Status: AC
  Administered 2017-04-29: 2 g via INTRAVENOUS

## 2017-04-29 MED ORDER — LIDOCAINE HCL (CARDIAC) 20 MG/ML IV SOLN
INTRAVENOUS | Status: DC | PRN
  Administered 2017-04-29: 30 mg via INTRAVENOUS

## 2017-04-29 MED ORDER — CEFAZOLIN SODIUM-DEXTROSE 2-4 GM/100ML-% IV SOLN
INTRAVENOUS | Status: AC
  Filled 2017-04-29: qty 100

## 2017-04-29 MED ORDER — IBUPROFEN 200 MG PO TABS: 600.0000 mg | ORAL_TABLET | Freq: Four times a day (QID) | ORAL | 0 refills | Status: DC | PRN

## 2017-04-29 SURGICAL SUPPLY — 62 items
BANDAGE COBAN STERILE 2 (GAUZE/BANDAGES/DRESSINGS) IMPLANT
BIT DRILL UPPR FCE 0.9M 4 TWST (BIT) ×1 IMPLANT
BLADE MINI RND TIP GREEN BEAV (BLADE) IMPLANT
BLADE SURG 15 STRL LF DISP TIS (BLADE) ×2 IMPLANT
BLADE SURG 15 STRL SS (BLADE) ×2
BNDG COHESIVE 1X5 TAN STRL LF (GAUZE/BANDAGES/DRESSINGS) IMPLANT
BNDG COHESIVE 4X5 TAN STRL (GAUZE/BANDAGES/DRESSINGS) ×2 IMPLANT
BNDG CONFORM 2 STRL LF (GAUZE/BANDAGES/DRESSINGS) ×2 IMPLANT
BNDG ESMARK 4X9 LF (GAUZE/BANDAGES/DRESSINGS) ×2 IMPLANT
BNDG GAUZE ELAST 4 BULKY (GAUZE/BANDAGES/DRESSINGS) ×2 IMPLANT
CHLORAPREP W/TINT 26ML (MISCELLANEOUS) ×2 IMPLANT
CORDS BIPOLAR (ELECTRODE) ×2 IMPLANT
COVER BACK TABLE 60X90IN (DRAPES) ×2 IMPLANT
COVER MAYO STAND STRL (DRAPES) ×2 IMPLANT
CUFF TOURNIQUET SINGLE 18IN (TOURNIQUET CUFF) ×2 IMPLANT
DEPRESSOR TONGUE BLADE STERILE (MISCELLANEOUS) ×2 IMPLANT
DRAPE C-ARM 42X72 X-RAY (DRAPES) ×2 IMPLANT
DRAPE EXTREMITY T 121X128X90 (DRAPE) ×2 IMPLANT
DRAPE IMP U-DRAPE 54X76 (DRAPES) ×2 IMPLANT
DRAPE SURG 17X23 STRL (DRAPES) ×2 IMPLANT
DRILL UPPERFACE 0.9M 4MM TWIST (BIT) ×2
DRSG EMULSION OIL 3X3 NADH (GAUZE/BANDAGES/DRESSINGS) ×2 IMPLANT
GAUZE SPONGE 4X4 12PLY STRL LF (GAUZE/BANDAGES/DRESSINGS) ×4 IMPLANT
GLOVE BIO SURGEON STRL SZ 6.5 (GLOVE) ×2 IMPLANT
GLOVE BIO SURGEON STRL SZ7.5 (GLOVE) ×2 IMPLANT
GLOVE BIOGEL PI IND STRL 7.0 (GLOVE) ×3 IMPLANT
GLOVE BIOGEL PI IND STRL 8 (GLOVE) ×1 IMPLANT
GLOVE BIOGEL PI INDICATOR 7.0 (GLOVE) ×3
GLOVE BIOGEL PI INDICATOR 8 (GLOVE) ×1
GLOVE ECLIPSE 6.5 STRL STRAW (GLOVE) ×2 IMPLANT
GOWN STRL REUS W/ TWL LRG LVL3 (GOWN DISPOSABLE) ×2 IMPLANT
GOWN STRL REUS W/TWL LRG LVL3 (GOWN DISPOSABLE) ×2
GOWN STRL REUS W/TWL XL LVL3 (GOWN DISPOSABLE) ×2 IMPLANT
NEEDLE HYPO 22GX1.5 SAFETY (NEEDLE) IMPLANT
NS IRRIG 1000ML POUR BTL (IV SOLUTION) ×2 IMPLANT
PACK BASIN DAY SURGERY FS (CUSTOM PROCEDURE TRAY) ×2 IMPLANT
PADDING CAST ABS 4INX4YD NS (CAST SUPPLIES) ×1
PADDING CAST ABS COTTON 4X4 ST (CAST SUPPLIES) ×1 IMPLANT
PLATE UPPER FACE 7H Y DOUBLE (Plate) ×2 IMPLANT
RUBBERBAND STERILE (MISCELLANEOUS) IMPLANT
SCREW UPPERFACE 1.2X10M SLFTAP (Screw) ×4 IMPLANT
SCREW UPPERFACE 1.2X6M SLF TAP (Screw) ×6 IMPLANT
SCREW UPPERFACE 1.2X8M SLF TAP (Screw) ×4 IMPLANT
SCREW UPPERFACE 1.4X5MM (Screw) ×2 IMPLANT
SHEET MEDIUM DRAPE 40X70 STRL (DRAPES) ×2 IMPLANT
SLEEVE SCD COMPRESS KNEE MED (MISCELLANEOUS) ×2 IMPLANT
SLING ARM FOAM STRAP XLG (SOFTGOODS) ×2 IMPLANT
SPLINT PLASTER CAST XFAST 4X15 (CAST SUPPLIES) ×5 IMPLANT
SPLINT PLASTER XTRA FAST SET 4 (CAST SUPPLIES) ×5
SPONGE SURGIFOAM ABS GEL 12-7 (HEMOSTASIS) ×2 IMPLANT
STOCKINETTE 6  STRL (DRAPES) ×1
STOCKINETTE 6 STRL (DRAPES) ×1 IMPLANT
SUT CHROMIC 6 0 PS 4 (SUTURE) IMPLANT
SUT ETHIBOND 3-0 V-5 (SUTURE) ×2 IMPLANT
SUT ETHILON 4 0 PS 2 18 (SUTURE) IMPLANT
SUT VICRYL RAPIDE 4-0 (SUTURE) IMPLANT
SUT VICRYL RAPIDE 4/0 PS 2 (SUTURE) ×2 IMPLANT
SYR 10ML LL (SYRINGE) IMPLANT
SYR BULB 3OZ (MISCELLANEOUS) ×2 IMPLANT
TOWEL OR 17X24 6PK STRL BLUE (TOWEL DISPOSABLE) ×2 IMPLANT
TOWEL OR NON WOVEN STRL DISP B (DISPOSABLE) ×2 IMPLANT
UNDERPAD 30X30 (UNDERPADS AND DIAPERS) ×2 IMPLANT

## 2017-04-29 NOTE — Anesthesia Postprocedure Evaluation (Signed)
Anesthesia Post Note  Patient: Roger Shelter  Procedure(s) Performed: Procedure(s) (LRB): OPEN TREATMENT OF RIGHT LONG FINGER  DISTAL PHALANX NONUNION WITH DISTAL RADIUS BONE GRAFT (Right)  Patient location during evaluation: PACU Anesthesia Type: General and Regional Level of consciousness: awake and alert Pain management: pain level controlled Vital Signs Assessment: post-procedure vital signs reviewed and stable Respiratory status: spontaneous breathing, nonlabored ventilation, respiratory function stable and patient connected to nasal cannula oxygen Cardiovascular status: blood pressure returned to baseline and stable Postop Assessment: no signs of nausea or vomiting Anesthetic complications: no       Last Vitals:  Vitals:   04/29/17 1130 04/29/17 1137  BP: (!) 142/90   Pulse: 83 90  Resp: (!) 23 (!) 28  Temp:      Last Pain:  Vitals:   04/29/17 1130  TempSrc:   PainSc: 0-No pain                 Phillips Grout

## 2017-04-29 NOTE — Transfer of Care (Signed)
Immediate Anesthesia Transfer of Care Note  Patient: Hayden Kemp  Procedure(s) Performed: Procedure(s): OPEN TREATMENT OF RIGHT LONG FINGER  DISTAL PHALANX NONUNION WITH DISTAL RADIUS BONE GRAFT (Right)  Patient Location: PACU  Anesthesia Type:GA combined with regional for post-op pain  Level of Consciousness: awake and patient cooperative  Airway & Oxygen Therapy: Patient Spontanous Breathing and Patient connected to face mask oxygen  Post-op Assessment: Report given to RN and Post -op Vital signs reviewed and stable  Post vital signs: Reviewed and stable  Last Vitals:  Vitals:   04/29/17 0900 04/29/17 1102  BP:    Pulse: 80 92  Resp: 20 (!) 26  Temp:      Last Pain:  Vitals:   04/29/17 0810  TempSrc: Oral  PainSc: 3       Patients Stated Pain Goal: 7 (04/29/17 0810)  Complications: No apparent anesthesia complications

## 2017-04-29 NOTE — Anesthesia Preprocedure Evaluation (Addendum)
Anesthesia Evaluation  Patient identified by MRN, date of birth, ID band Patient awake    Reviewed: Allergy & Precautions, NPO status , Patient's Chart, lab work & pertinent test results  Airway Mallampati: II  TM Distance: >3 FB Neck ROM: Full    Dental no notable dental hx.    Pulmonary former smoker,    Pulmonary exam normal breath sounds clear to auscultation       Cardiovascular hypertension, Pt. on medications Normal cardiovascular exam Rhythm:Regular Rate:Normal     Neuro/Psych negative neurological ROS  negative psych ROS   GI/Hepatic negative GI ROS, Neg liver ROS,   Endo/Other  diabetes, Type 2  Renal/GU negative Renal ROS  negative genitourinary   Musculoskeletal negative musculoskeletal ROS (+)   Abdominal   Peds negative pediatric ROS (+)  Hematology negative hematology ROS (+)   Anesthesia Other Findings   Reproductive/Obstetrics negative OB ROS                             Anesthesia Physical Anesthesia Plan  ASA: II  Anesthesia Plan: General   Post-op Pain Management:  Regional for Post-op pain   Induction: Intravenous  Airway Management Planned: LMA  Additional Equipment:   Intra-op Plan:   Post-operative Plan: Extubation in OR  Informed Consent: I have reviewed the patients History and Physical, chart, labs and discussed the procedure including the risks, benefits and alternatives for the proposed anesthesia with the patient or authorized representative who has indicated his/her understanding and acceptance.   Dental advisory given  Plan Discussed with: CRNA  Anesthesia Plan Comments:        Anesthesia Quick Evaluation

## 2017-04-29 NOTE — Op Note (Signed)
04/29/2017  8:49 AM  PATIENT:  Roger Shelter  67 y.o. male  PRE-OPERATIVE DIAGNOSIS:  Right long finger distal phalanx nonunion  POST-OPERATIVE DIAGNOSIS:  Same  PROCEDURE:  Open treatment of right long finger distal phalanx nonunion with autograft harvested from distal radius  SURGEON: Cliffton Asters. Janee Morn, MD  PHYSICIAN ASSISTANT: Danielle Rankin, OPA-C  ANESTHESIA:  regional and general  SPECIMENS:  None  DRAINS:   None  EBL:  less than 50 mL  PREOPERATIVE INDICATIONS:  YUCHEN FEDOR is a  67 y.o. male with a nonunion through the waist of the right long finger distal phalanx following badly comminuted injury.  There is gross motion and pain associated with it.  The risks benefits and alternatives were discussed with the patient preoperatively including but not limited to the risks of infection, bleeding, nerve injury, cardiopulmonary complications, the need for revision surgery, among others, and the patient verbalized understanding and consented to proceed.  OPERATIVE IMPLANTS: Styker Leibinger maxillofacial plate, "x" shape, 1.2 mm with associated screws  OPERATIVE PROCEDURE:  After receiving prophylactic antibiotics and a regional block, the patient was escorted to the operative theatre and placed in a supine position.  General anesthesia was administered.  A surgical "time-out" was performed during which the planned procedure, proposed operative site, and the correct patient identity were compared to the operative consent and agreement confirmed by the circulating nurse according to current facility policy.  Following application of a tourniquet to the operative extremity, the exposed skin was prepped with Chloraprep and draped in the usual sterile fashion.  The limb was exsanguinated with an Esmarch bandage and the tourniquet inflated to approximately higher than systolic BP.  The distal phalanx was approached through a direct volar midline approach.  The flaps were  retracted radially and ulnarly.  The nonunion was identified and fibrous tissue was excisionally debrided.  The 2 larger base fragments appeared to have united.  There was nonunion between this chunk and the distal fragment, which was thin, amounting to a dorsal cortical shell.  He prominence of the spica pointed volarly at the base was debrided with the rongeur, and then a separate 2 inch incision was made in the interval between the first and second dorsal compartments of the distal radius.  The superficial radial nerve was retracted palmarly and the periosteum incised between the first and second compartments.  A cortical cancellus graft was harvested as well as some additional cancellous bone.  Ultimately, this was irrigated, Gelfoam placed, and he was closed with 4-0 Vicryl Rapide running horizontal mattress sutures in the skin after the tourniquet had been deflated.  Once the bone graft was harvested, the wound was irrigated and then the instrumentation of the graft initiated.  At first, I thought that perhaps one of the slightly curved 4-hole plates would be best, but then shifted to the X plate.  This was from the 1.2 mm Leibinger craniofacial set.  This allowed for 3 good points of fixation into the proximal chunk of bone, and the remainder distally.  The nonunion was first packed with cancellus bone before the cortical cancellus strut was placed.  It was thicker proximally and then tapered distally much like the native bone.  Once it was secured in this fashion, final images were obtained.  It was found to be stable, except for distally the dorsal shell of bone became disengaged from the tips of the distal screws.  A 3-0 Tycron suture was placed around the distal portion of  the dorsal bone and secured to the construct to help prevent disengagement.  Tourniquet was released the wound irrigated and the skin closed with 4-0 Vicryl Rapide interrupted sutures.  The wrist was close at this time.  A digital  dressing with volar and dorsal tongue blade component was placed and a short arm splint dressing also applied for the sake of the bone graft harvest site.  He was awakened and taken to the recovery room stable condition, breathing spontaneously.  DISPOSITION: He will be discharged home, with typical instructions, returning in 10-15 days, with new x-rays of the right long finger out of his splint and with follow-on hand therapy appointment for a custom splint fabrication/modifications of his existing splint.

## 2017-04-29 NOTE — Discharge Instructions (Signed)
Discharge Instructions   You have a dressing with a plaster splint incorporated in it. Move your fingers as much as possible, making a full fist and fully opening the fist. Elevate your hand above your elbow to reduce pain & swelling of the digits.  Ice over the operative site or in the arm pit may be helpful to reduce pain & swelling.  DO NOT USE HEAT. Pain medicine has been prescribed for you.  Use Tylenol as it states on the bottle  every 6 hours. Use Oxycodone 5 mg as a rescue medicine for severe break through pain. Leave the dressing in place until you return to our office.  You may shower, but keep the bandage clean & dry.  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 Our office will call you to arrange follow-up   Please call [phone] during normal business hours or [phone] after hours for any problems. Including the following:  - excessive redness of the incisions - drainage for more than 4 days - fever of more than 101.5 F  *Please note that pain medications will not be refilled after hours or on weekends.  No lifting gripping or grasping greater than pencil and paper tasks with the right hand. May return to work with restrictions on Wednesday 05/01/2017.   Post Anesthesia Home Care Instructions  Activity: Get plenty of rest for the remainder of the day. A responsible individual must stay with you for 24 hours following the procedure.  For the next 24 hours, DO NOT: -Drive a car -Advertising copywriter -Drink alcoholic beverages -Take any medication unless instructed by your physician -Make any legal decisions or sign important papers.  Meals: Start with liquid foods such as gelatin or soup. Progress to regular foods as tolerated. Avoid greasy, spicy, heavy foods. If nausea and/or vomiting occur, drink only clear liquids until the nausea and/or vomiting subsides. Call your physician if  vomiting continues.  Special Instructions/Symptoms: Your throat may feel dry or sore from the anesthesia or the breathing tube placed in your throat during surgery. If this causes discomfort, gargle with warm salt water. The discomfort should disappear within 24 hours.  If you had a scopolamine patch placed behind your ear for the management of post- operative nausea and/or vomiting:  1. The medication in the patch is effective for 72 hours, after which it should be removed.  Wrap patch in a tissue and discard in the trash. Wash hands thoroughly with soap and water. 2. You may remove the patch earlier than 72 hours if you experience unpleasant side effects which may include dry mouth, dizziness or visual disturbances. 3. Avoid touching the patch. Wash your hands with soap and water after contact with the patch.  Regional Anesthesia Blocks  1. Numbness or the inability to move the "blocked" extremity may last from 3-48 hours after placement. The length of time depends on the medication injected and your individual response to the medication. If the numbness is not going away after 48 hours, call your surgeon.  2. The extremity that is blocked will need to be protected until the numbness is gone and the  Strength has returned. Because you cannot feel it, you will need to take extra care to avoid injury. Because it may be weak, you may have difficulty moving it or using it. You may not know what position it is in without looking at it while the block is  in effect.  3. For blocks in the legs and feet, returning to weight bearing and walking needs to be done carefully. You will need to wait until the numbness is entirely gone and the strength has returned. You should be able to move your leg and foot normally before you try and bear weight or walk. You will need someone to be with you when you first try to ensure you do not fall and possibly risk injury.  4. Bruising and tenderness at the needle site  are common side effects and will resolve in a few days.  5. Persistent numbness or new problems with movement should be communicated to the surgeon or the [facility] [phone] [facility] [phone]).

## 2017-04-29 NOTE — Interval H&P Note (Signed)
History and Physical Interval Note:  04/29/2017 7:26 AM  Hayden Kemp  has presented today for surgery, with the diagnosis of RIGHT LONG FINGER DISTAL PHALANX NONUNION Z61.096E  The various methods of treatment have been discussed with the patient and family. After consideration of risks, benefits and other options for treatment, the patient has consented to  Procedure(s): OPEN TREATMENT OF RIGHT LONG FINGER  DISTAL PHALANX NONUNION WITH POSSIBLE DISTAL RADIUS BONE GRAFT (Right) as a surgical intervention .  The patient's history has been reviewed, patient examined, no change in status, stable for surgery.  I have reviewed the patient's chart and labs.  Questions were answered to the patient's satisfaction.     Hayden Kemp A.

## 2017-04-29 NOTE — Anesthesia Procedure Notes (Signed)
Procedure Name: LMA Insertion Date/Time: 04/29/2017 9:11 AM Performed by: Hattie Aguinaldo D Pre-anesthesia Checklist: Patient identified, Emergency Drugs available, Suction available and Patient being monitored Patient Re-evaluated:Patient Re-evaluated prior to inductionOxygen Delivery Method: Circle system utilized Preoxygenation: Pre-oxygenation with 100% oxygen Intubation Type: IV induction Ventilation: Mask ventilation without difficulty LMA: LMA inserted LMA Size: 4.0 Number of attempts: 1 Airway Equipment and Method: Bite block Placement Confirmation: positive ETCO2 Tube secured with: Tape Dental Injury: Teeth and Oropharynx as per pre-operative assessment

## 2017-04-29 NOTE — Progress Notes (Signed)
Assisted Dr. Carignan with right, ultrasound guided, supraclavicular block. Side rails up, monitors on throughout procedure. See vital signs in flow sheet. Tolerated Procedure well. 

## 2017-04-29 NOTE — Anesthesia Procedure Notes (Signed)
Anesthesia Regional Block: Supraclavicular block   Pre-Anesthetic Checklist: ,, timeout performed, Correct Patient, Correct Site, Correct Laterality, Correct Procedure, Correct Position, site marked, Risks and benefits discussed,  Surgical consent,  Pre-op evaluation,  At surgeon's request and post-op pain management  Laterality: Right and Upper  Prep: Maximum Sterile Barrier Precautions used, chloraprep       Needles:  Injection technique: Single-shot  Needle Type: Echogenic Stimulator Needle     Needle Length: 10cm      Additional Needles:   Procedures: ultrasound guided,,,,,,,,  Narrative:  Start time: 04/29/2017 8:41 AM End time: 04/29/2017 8:51 AM Injection made incrementally with aspirations every 5 mL.  Performed by: Personally  Anesthesiologist: Phillips Grout  Additional Notes: Risks, benefits and alternative to block explained extensively.  Patient tolerated procedure well, without complications.

## 2017-04-30 ENCOUNTER — Encounter (HOSPITAL_BASED_OUTPATIENT_CLINIC_OR_DEPARTMENT_OTHER): Payer: Self-pay | Admitting: Orthopedic Surgery

## 2018-12-18 ENCOUNTER — Ambulatory Visit (INDEPENDENT_AMBULATORY_CARE_PROVIDER_SITE_OTHER): Payer: Self-pay

## 2018-12-18 ENCOUNTER — Other Ambulatory Visit: Payer: Self-pay | Admitting: Gerontology

## 2018-12-18 DIAGNOSIS — M25562 Pain in left knee: Secondary | ICD-10-CM

## 2019-03-05 IMAGING — DX DG KNEE COMPLETE 4+V*L*
4 series · 4 of 4 positions shown · non-contrast
Comparison: None.

CLINICAL DATA: Acute left knee pain after fall 2 days ago.

EXAM:
LEFT KNEE - COMPLETE 4+ VIEW

[knee ap]
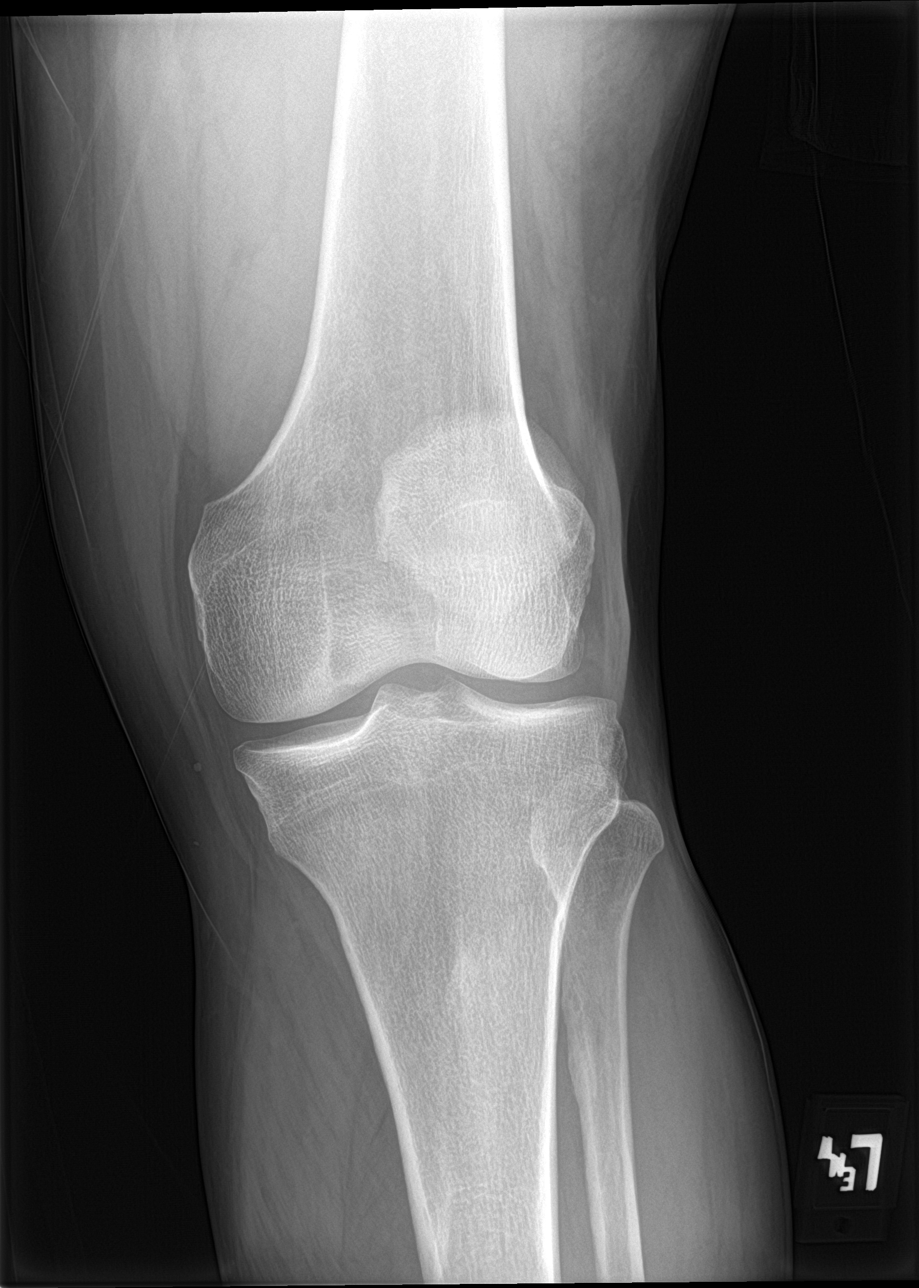

[knee lat]
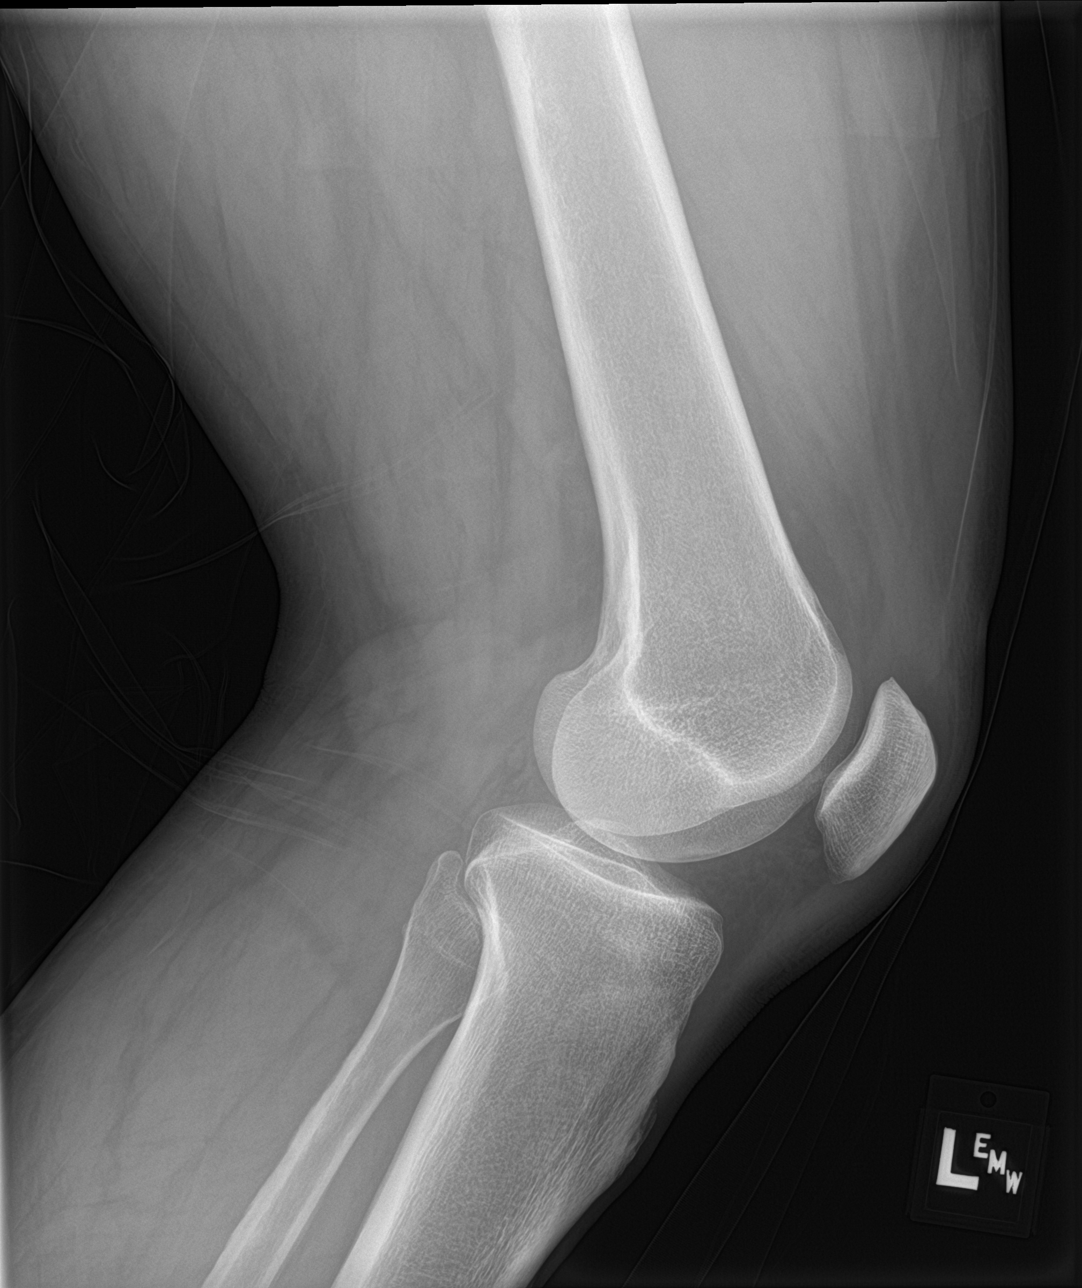

[knee obl (1 of 2)]
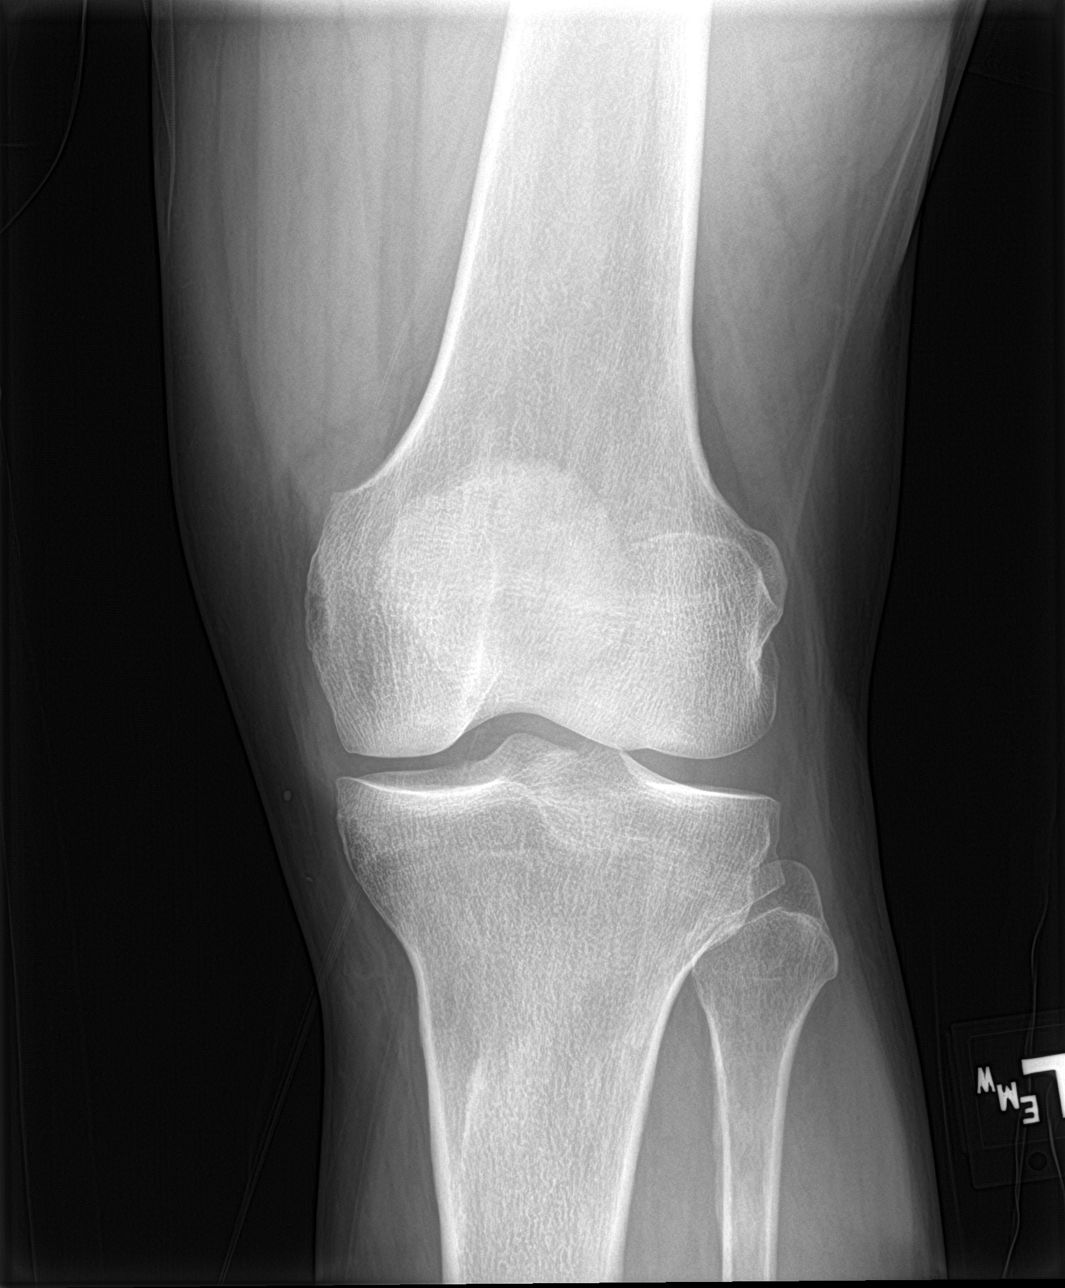

[knee obl (2 of 2)]
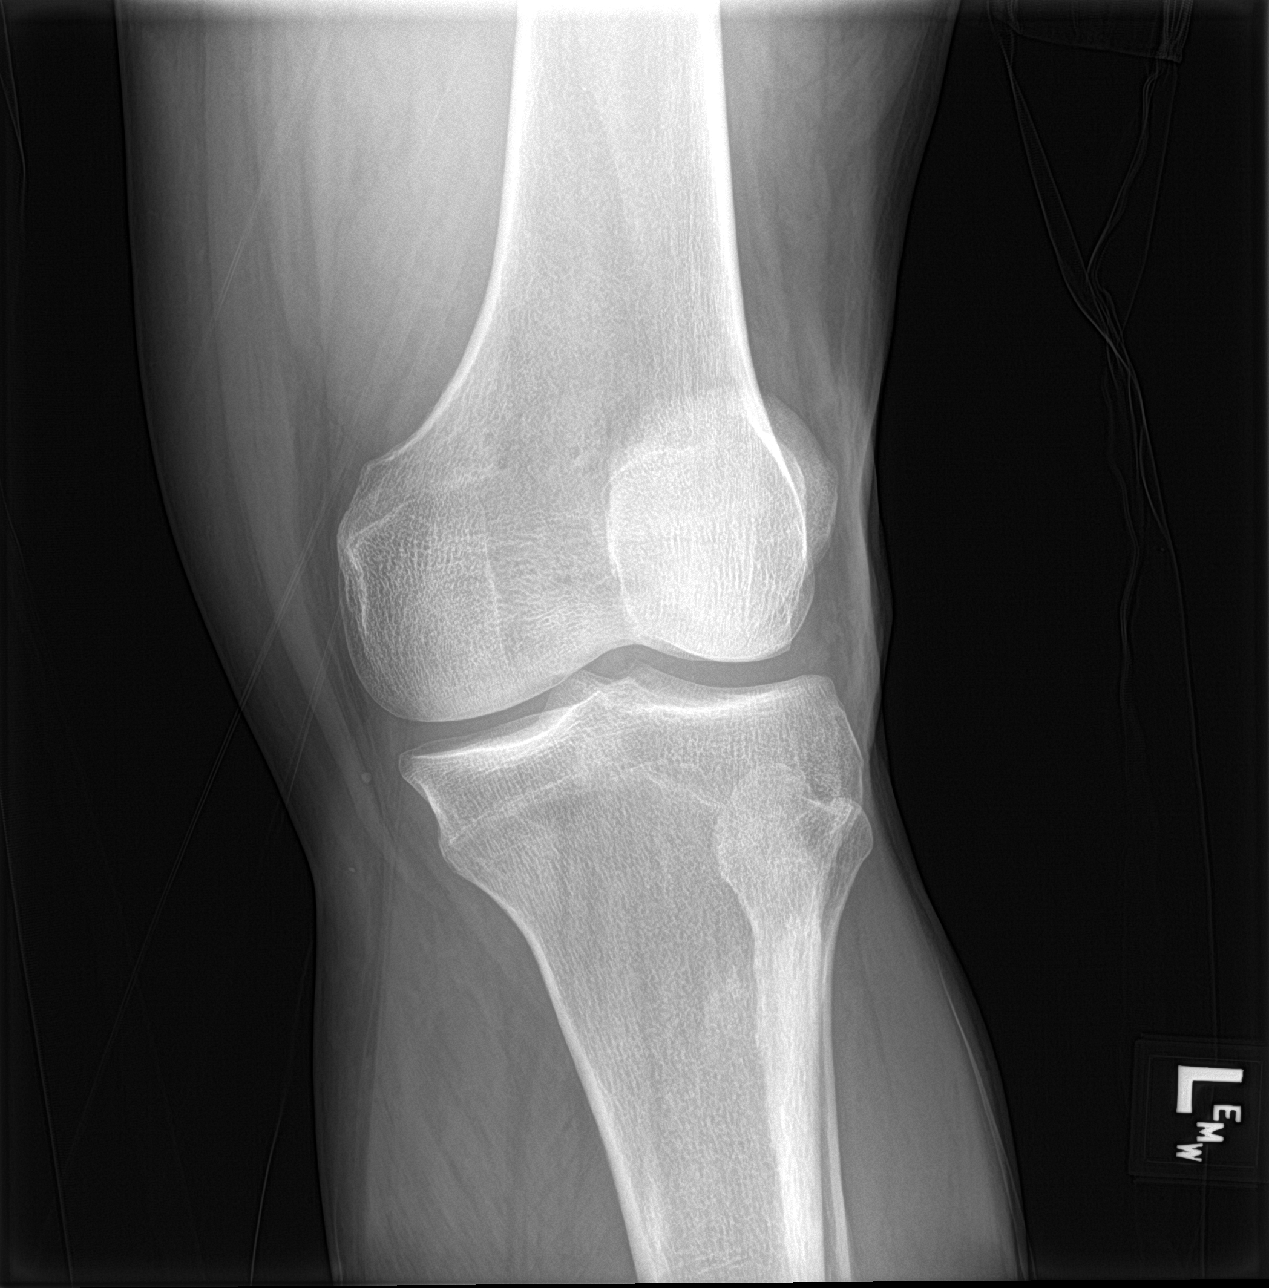

[4 of 4 positions shown; findings below may reference images not displayed]

FINDINGS: No evidence of fracture, dislocation, or joint effusion. No evidence
of arthropathy or other focal bone abnormality. Soft tissues are
unremarkable.
IMPRESSION: Negative.

## 2021-09-13 ENCOUNTER — Emergency Department (HOSPITAL_BASED_OUTPATIENT_CLINIC_OR_DEPARTMENT_OTHER)
Admission: EM | Admit: 2021-09-13 | Discharge: 2021-09-13 | Disposition: A | Discharge status: Home / Self Care | Payer: Medicare (Managed Care) | Attending: Emergency Medicine | Admitting: Emergency Medicine

## 2021-09-13 ENCOUNTER — Other Ambulatory Visit: Payer: Self-pay

## 2021-09-13 DIAGNOSIS — Z79899 Other long term (current) drug therapy: Secondary | ICD-10-CM | POA: Insufficient documentation

## 2021-09-13 DIAGNOSIS — I10 Essential (primary) hypertension: Secondary | ICD-10-CM | POA: Insufficient documentation

## 2021-09-13 DIAGNOSIS — Z87891 Personal history of nicotine dependence: Secondary | ICD-10-CM | POA: Insufficient documentation

## 2021-09-13 DIAGNOSIS — M5442 Lumbago with sciatica, left side: Secondary | ICD-10-CM | POA: Diagnosis not present

## 2021-09-13 DIAGNOSIS — Z7982 Long term (current) use of aspirin: Secondary | ICD-10-CM | POA: Insufficient documentation

## 2021-09-13 DIAGNOSIS — M5441 Lumbago with sciatica, right side: Secondary | ICD-10-CM | POA: Diagnosis not present

## 2021-09-13 DIAGNOSIS — E119 Type 2 diabetes mellitus without complications: Secondary | ICD-10-CM | POA: Insufficient documentation

## 2021-09-13 DIAGNOSIS — M545 Low back pain, unspecified: Secondary | ICD-10-CM | POA: Diagnosis present

## 2021-09-13 DIAGNOSIS — M79675 Pain in left toe(s): Secondary | ICD-10-CM | POA: Insufficient documentation

## 2021-09-13 DIAGNOSIS — Z7984 Long term (current) use of oral hypoglycemic drugs: Secondary | ICD-10-CM | POA: Insufficient documentation

## 2021-09-13 LAB — URINALYSIS, ROUTINE W REFLEX MICROSCOPIC
Bilirubin Urine: NEGATIVE
Glucose, UA: NEGATIVE mg/dL
Hgb urine dipstick: NEGATIVE
Ketones, ur: NEGATIVE mg/dL
Leukocytes,Ua: NEGATIVE
Nitrite: NEGATIVE
Protein, ur: NEGATIVE mg/dL
Specific Gravity, Urine: 1.015 (ref 1.005–1.030)
pH: 5 (ref 5.0–8.0)

## 2021-09-13 LAB — CBG MONITORING, ED: Glucose-Capillary: 125 mg/dL — ABNORMAL HIGH (ref 70–99)

## 2021-09-13 MED ORDER — NAPROXEN 375 MG PO TABS: 375.0000 mg | ORAL_TABLET | Freq: Two times a day (BID) | ORAL | 0 refills | Status: DC

## 2021-09-13 NOTE — ED Triage Notes (Signed)
Reports he is a diabetic and hasn't had his A1C checked and would like to have a regular check up.  Reports numbness in both lower ext for the last two weeks.  Also endorses sore toe on the left foot beside the great toe after walking yesterday.

## 2021-09-13 NOTE — ED Provider Notes (Signed)
[facility] EMERGENCY DEPARTMENT Provider Note   CSN: [id number] Arrival date & time: 09/13/21  1034     History No chief complaint on file.   Hayden Kemp is a 71 y.o. male.  Patient presents today with left second toe pain and bilateral upper leg pain. Concern for diabetic complications. History of DM2, well controlled with current medications.  Toe discomfort began after patients daily walk two days ago. He also endorses low back discomfort with occasional shooting pain from back to both thighs. Seems to worsen after extended sitting. Previous back surgery. No recent illness, injury or fall. Patient has an appointment with his provider next month.  The history is provided by the patient. No language interpreter was used.  Extremity Pain This is a new problem. The problem occurs daily. Pertinent negatives include no chest pain, no abdominal pain, no headaches and no shortness of breath.      Past Medical History:  Diagnosis Date   Diabetes mellitus without complication (HCC)    Hypertension    Seasonal allergies     There are no problems to display for this patient.   Past Surgical History:  Procedure Laterality Date   BACK SURGERY     NAILBED REPAIR Right 12/04/2016   Procedure: NAILBED REPAIR;  Surgeon: Mack Hook, MD;  Location: [facility];  Service: Orthopedics;  Laterality: Right;   OPEN REDUCTION INTERNAL FIXATION (ORIF) DISTAL PHALANX Right 12/04/2016   Procedure: OPEN TREATMENT OF RIGHT LONG FINGER  DISTAL PHALANX FRACTURE AND NAILBED INJURY;  Surgeon: Mack Hook, MD;  Location: [facility];  Service: Orthopedics;  Laterality: Right;   OPEN REDUCTION INTERNAL FIXATION (ORIF) FINGER WITH RADIAL BONE GRAFT Right 04/29/2017   Procedure: OPEN TREATMENT OF RIGHT LONG FINGER  DISTAL PHALANX NONUNION WITH DISTAL RADIUS BONE GRAFT;  Surgeon: Mack Hook, MD;  Location: [facility];  Service: Orthopedics;   Laterality: Right;       No family history on file.  Social History   Tobacco Use   Smoking status: Former   Smokeless tobacco: Never  Building services engineer Use: Never used  Substance Use Topics   Alcohol use: Yes    Comment: social   Drug use: No    Home Medications Prior to Admission medications   Medication Sig Start Date End Date Taking? Authorizing Provider  acetaminophen (TYLENOL) 325 MG tablet Take 2 tablets (650 mg total) by mouth every 6 (six) hours as needed for mild pain or moderate pain. 04/29/17   Mack Hook, MD  aspirin EC 81 MG tablet Take 81 mg by mouth daily.    [provider]  glipiZIDE (GLUCOTROL) 10 MG tablet Take 10 mg by mouth daily before breakfast.    [provider]  losartan-hydrochlorothiazide (HYZAAR) 100-12.5 MG tablet Take 1 tablet by mouth daily.    [provider]  metFORMIN (GLUCOPHAGE) 500 MG tablet Take by mouth 2 (two) times daily with a meal.    [provider]  Multiple Vitamin (MULTIVITAMIN WITH MINERALS) TABS tablet Take 1 tablet by mouth daily.    [provider]  omega-3 acid ethyl esters (LOVAZA) 1 g capsule Take by mouth 2 (two) times daily.    [provider]  oxyCODONE (ROXICODONE) 5 MG immediate release tablet Take 1 tablet (5 mg total) by mouth every 6 (six) hours as needed for breakthrough pain. 04/29/17   Mack Hook, MD  sitaGLIPtin-metformin (JANUMET) 50-1000 MG tablet Take 1 tablet  by mouth 2 (two) times daily with a meal.    [provider]    Allergies    Pollen extract  Review of Systems   Review of Systems  Constitutional:  Negative for fever.  Respiratory:  Negative for shortness of breath.   Cardiovascular:  Negative for chest pain.  Gastrointestinal:  Negative for abdominal pain.  Musculoskeletal:  Positive for back pain.  Skin:  Negative for wound.  Neurological:  Negative for headaches.  All other systems reviewed and are  negative.  Physical Exam Updated Vital Signs BP (!) 142/100   Pulse 60   Temp 97.8 F (36.6 C) (Oral)   Resp 18   Ht 6' (1.829 m)   Wt 110.2 kg   SpO2 100%   BMI 32.96 kg/m   Physical Exam Vitals and nursing note reviewed.  Constitutional:      Appearance: Normal appearance.  HENT:     Head: Normocephalic.  Eyes:     Conjunctiva/sclera: Conjunctivae normal.  Cardiovascular:     Rate and Rhythm: Normal rate.  Pulmonary:     Effort: Pulmonary effort is normal.  Abdominal:     Palpations: Abdomen is soft.  Musculoskeletal:        General: Tenderness present. Normal range of motion.  Skin:    General: Skin is warm and dry.  Neurological:     Mental Status: He is alert and oriented to person, place, and time.  Psychiatric:        Mood and Affect: Mood normal.        Behavior: Behavior normal.   Mild lumber tenderness to palpation with radiation of sharp, shooting pain to bilateral upper legs. No extremity weakness.  No indication of infection to second toe of left foot. Appearance of early callus formation.  ED Results / Procedures / Treatments   Labs (all labs ordered are listed, but only abnormal results are displayed) Labs Reviewed  CBG MONITORING, ED - Abnormal; Notable for the following components:      Result Value   Glucose-Capillary 125 (*)    All other components within normal limits  URINALYSIS, ROUTINE W REFLEX MICROSCOPIC    EKG None  Radiology No results found.  Procedures Procedures   Medications Ordered in ED Medications - No data to display  ED Course  I have reviewed the triage vital signs and the nursing notes.  Pertinent labs & imaging results that were available during my care of the patient were reviewed by me and considered in my medical decision making (see chart for details).    MDM Rules/Calculators/A&P                           Left second toe pain. No signs of infection. Likely early callus.   Patient with back pain  and bilateral sciatica.  No neurological deficits and normal neuro exam.  Patient is ambulatory.  No loss of bowel or bladder control.  No concern for cauda equina.  No fever, night sweats, weight loss, h/o cancer, IVDA, no recent procedure to back. No urinary symptoms suggestive of UTI.  Supportive care and return precaution discussed. Appears safe for discharge at this time. Follow up as indicated in discharge paperwork.   Final Clinical Impression(s) / ED Diagnoses Final diagnoses:  Acute midline low back pain with bilateral sciatica  Pain of toe of left foot    Rx / DC Orders ED Discharge Orders  Ordered    naproxen (NAPROSYN) 375 MG tablet  2 times daily        09/13/21 1644             Felicie Morn, NP 09/13/21 1704    Gloris Manchester, MD 09/14/21 [id number]

## 2021-09-13 NOTE — Discharge Instructions (Addendum)
Please refer to the attached instructions. Your toe does not show any sign of infection. Continue to monitor. Take medication as directed. Follow-up with your primary care provider as scheduled.

## 2022-06-24 ENCOUNTER — Other Ambulatory Visit: Payer: Self-pay

## 2022-06-24 ENCOUNTER — Emergency Department (HOSPITAL_BASED_OUTPATIENT_CLINIC_OR_DEPARTMENT_OTHER): Payer: Medicare (Managed Care)

## 2022-06-24 ENCOUNTER — Encounter (HOSPITAL_BASED_OUTPATIENT_CLINIC_OR_DEPARTMENT_OTHER): Payer: Self-pay | Admitting: Emergency Medicine

## 2022-06-24 ENCOUNTER — Inpatient Hospital Stay (HOSPITAL_BASED_OUTPATIENT_CLINIC_OR_DEPARTMENT_OTHER)
Admission: EM | Admit: 2022-06-24 | Discharge: 2022-06-26 | DRG: 872 | Disposition: A | Discharge status: Home / Self Care | Payer: Medicare (Managed Care) | Attending: Internal Medicine | Admitting: Internal Medicine

## 2022-06-24 DIAGNOSIS — Z79899 Other long term (current) drug therapy: Secondary | ICD-10-CM | POA: Diagnosis not present

## 2022-06-24 DIAGNOSIS — E1165 Type 2 diabetes mellitus with hyperglycemia: Secondary | ICD-10-CM | POA: Diagnosis present

## 2022-06-24 DIAGNOSIS — E860 Dehydration: Secondary | ICD-10-CM | POA: Diagnosis not present

## 2022-06-24 DIAGNOSIS — R509 Fever, unspecified: Secondary | ICD-10-CM | POA: Diagnosis present

## 2022-06-24 DIAGNOSIS — Z6832 Body mass index (BMI) 32.0-32.9, adult: Secondary | ICD-10-CM

## 2022-06-24 DIAGNOSIS — Z91048 Other nonmedicinal substance allergy status: Secondary | ICD-10-CM

## 2022-06-24 DIAGNOSIS — J302 Other seasonal allergic rhinitis: Secondary | ICD-10-CM | POA: Diagnosis present

## 2022-06-24 DIAGNOSIS — B349 Viral infection, unspecified: Secondary | ICD-10-CM | POA: Diagnosis present

## 2022-06-24 DIAGNOSIS — N12 Tubulo-interstitial nephritis, not specified as acute or chronic: Principal | ICD-10-CM

## 2022-06-24 DIAGNOSIS — R6510 Systemic inflammatory response syndrome (SIRS) of non-infectious origin without acute organ dysfunction: Secondary | ICD-10-CM

## 2022-06-24 DIAGNOSIS — R7989 Other specified abnormal findings of blood chemistry: Secondary | ICD-10-CM

## 2022-06-24 DIAGNOSIS — N179 Acute kidney failure, unspecified: Secondary | ICD-10-CM | POA: Diagnosis present

## 2022-06-24 DIAGNOSIS — Z7982 Long term (current) use of aspirin: Secondary | ICD-10-CM | POA: Diagnosis not present

## 2022-06-24 DIAGNOSIS — Z87891 Personal history of nicotine dependence: Secondary | ICD-10-CM | POA: Diagnosis not present

## 2022-06-24 DIAGNOSIS — A4189 Other specified sepsis: Principal | ICD-10-CM | POA: Diagnosis present

## 2022-06-24 DIAGNOSIS — E871 Hypo-osmolality and hyponatremia: Secondary | ICD-10-CM | POA: Diagnosis present

## 2022-06-24 DIAGNOSIS — I10 Essential (primary) hypertension: Secondary | ICD-10-CM | POA: Diagnosis present

## 2022-06-24 DIAGNOSIS — E669 Obesity, unspecified: Secondary | ICD-10-CM | POA: Diagnosis present

## 2022-06-24 DIAGNOSIS — Z7984 Long term (current) use of oral hypoglycemic drugs: Secondary | ICD-10-CM

## 2022-06-24 DIAGNOSIS — R7401 Elevation of levels of liver transaminase levels: Secondary | ICD-10-CM | POA: Diagnosis present

## 2022-06-24 DIAGNOSIS — E785 Hyperlipidemia, unspecified: Secondary | ICD-10-CM

## 2022-06-24 DIAGNOSIS — E876 Hypokalemia: Secondary | ICD-10-CM | POA: Diagnosis present

## 2022-06-24 DIAGNOSIS — E119 Type 2 diabetes mellitus without complications: Secondary | ICD-10-CM

## 2022-06-24 DIAGNOSIS — Z20822 Contact with and (suspected) exposure to covid-19: Secondary | ICD-10-CM | POA: Diagnosis present

## 2022-06-24 LAB — HEPATITIS PANEL, ACUTE
HCV Ab: NONREACTIVE
Hep A IgM: NONREACTIVE
Hep B C IgM: NONREACTIVE
Hepatitis B Surface Ag: NONREACTIVE

## 2022-06-24 LAB — URINALYSIS, MICROSCOPIC (REFLEX)

## 2022-06-24 LAB — COMPREHENSIVE METABOLIC PANEL
ALT: 75 U/L — ABNORMAL HIGH (ref 0–44)
AST: 115 U/L — ABNORMAL HIGH (ref 15–41)
Albumin: 3.3 g/dL — ABNORMAL LOW (ref 3.5–5.0)
Alkaline Phosphatase: 86 U/L (ref 38–126)
Anion gap: 9 (ref 5–15)
BUN: 36 mg/dL — ABNORMAL HIGH (ref 8–23)
CO2: 24 mmol/L (ref 22–32)
Calcium: 8.5 mg/dL — ABNORMAL LOW (ref 8.9–10.3)
Chloride: 97 mmol/L — ABNORMAL LOW (ref 98–111)
Creatinine, Ser: 2.26 mg/dL — ABNORMAL HIGH (ref 0.61–1.24)
GFR, Estimated: 31 mL/min — ABNORMAL LOW (ref >60)
Glucose, Bld: 198 mg/dL — ABNORMAL HIGH (ref 70–99)
Potassium: 3.3 mmol/L — ABNORMAL LOW (ref 3.5–5.1)
Sodium: 130 mmol/L — ABNORMAL LOW (ref 135–145)
Total Bilirubin: 1.1 mg/dL (ref 0.3–1.2)
Total Protein: 7.5 g/dL (ref 6.5–8.1)

## 2022-06-24 LAB — URINALYSIS, ROUTINE W REFLEX MICROSCOPIC
Bilirubin Urine: NEGATIVE
Glucose, UA: NEGATIVE mg/dL
Ketones, ur: NEGATIVE mg/dL
Leukocytes,Ua: NEGATIVE
Nitrite: NEGATIVE
Protein, ur: 100 mg/dL — AB
Specific Gravity, Urine: 1.02 (ref 1.005–1.030)
pH: 5.5 (ref 5.0–8.0)

## 2022-06-24 LAB — CBC WITH DIFFERENTIAL/PLATELET
Abs Immature Granulocytes: 0.1 10*3/uL — ABNORMAL HIGH (ref 0.00–0.07)
Basophils Absolute: 0.1 10*3/uL (ref 0.0–0.1)
Basophils Relative: 0 %
Eosinophils Absolute: 0.1 10*3/uL (ref 0.0–0.5)
Eosinophils Relative: 1 %
HCT: 37 % — ABNORMAL LOW (ref 39.0–52.0)
Hemoglobin: 13 g/dL (ref 13.0–17.0)
Immature Granulocytes: 1 %
Lymphocytes Relative: 8 %
Lymphs Abs: 1.2 10*3/uL (ref 0.7–4.0)
MCH: 30.6 pg (ref 26.0–34.0)
MCHC: 35.1 g/dL (ref 30.0–36.0)
MCV: 87.1 fL (ref 80.0–100.0)
Monocytes Absolute: 2.6 10*3/uL — ABNORMAL HIGH (ref 0.1–1.0)
Monocytes Relative: 18 %
Neutro Abs: 10.8 10*3/uL — ABNORMAL HIGH (ref 1.7–7.7)
Neutrophils Relative %: 72 %
Platelets: 291 10*3/uL (ref 150–400)
RBC: 4.25 MIL/uL (ref 4.22–5.81)
RDW: 13.3 % (ref 11.5–15.5)
WBC: 14.9 10*3/uL — ABNORMAL HIGH (ref 4.0–10.5)
nRBC: 0 % (ref 0.0–0.2)

## 2022-06-24 LAB — SARS CORONAVIRUS 2 BY RT PCR: SARS Coronavirus 2 by RT PCR: NEGATIVE

## 2022-06-24 LAB — RAPID INFLUENZA A&B ANTIGENS
Influenza A (ARMC): NEGATIVE
Influenza B (ARMC): NEGATIVE

## 2022-06-24 LAB — LIPASE, BLOOD: Lipase: 36 U/L (ref 11–51)

## 2022-06-24 LAB — MAGNESIUM: Magnesium: 2 mg/dL (ref 1.7–2.4)

## 2022-06-24 LAB — GLUCOSE, CAPILLARY
Glucose-Capillary: 169 mg/dL — ABNORMAL HIGH (ref 70–99)
Glucose-Capillary: 220 mg/dL — ABNORMAL HIGH (ref 70–99)
Glucose-Capillary: 153 mg/dL — ABNORMAL HIGH (ref 70–99)

## 2022-06-24 LAB — LACTIC ACID, PLASMA: Lactic Acid, Venous: 1.2 mmol/L (ref 0.5–1.9)

## 2022-06-24 MED ORDER — SODIUM CHLORIDE 0.9 % IV SOLN
INTRAVENOUS | Status: DC
Start: 2022-06-24 — End: 2022-06-26

## 2022-06-24 MED ORDER — ACETAMINOPHEN 325 MG PO TABS
650.0000 mg | ORAL_TABLET | Freq: Four times a day (QID) | ORAL | Status: DC | PRN
  Administered 2022-06-24: 650 mg via ORAL
  Filled 2022-06-24: qty 2

## 2022-06-24 MED ORDER — MORPHINE SULFATE (PF) 2 MG/ML IV SOLN: 2.0000 mg | INTRAVENOUS | Status: DC | PRN

## 2022-06-24 MED ORDER — LACTATED RINGERS IV BOLUS (SEPSIS)
2000.0000 mL | Freq: Once | INTRAVENOUS | Status: AC
  Administered 2022-06-24: 2000 mL via INTRAVENOUS

## 2022-06-24 MED ORDER — SODIUM CHLORIDE 0.9 % IV SOLN
2.0000 g | Freq: Once | INTRAVENOUS | Status: AC
  Administered 2022-06-24: 2 g via INTRAVENOUS
  Filled 2022-06-24: qty 20

## 2022-06-24 MED ORDER — SODIUM CHLORIDE 0.9 % IV SOLN: INTRAVENOUS | Status: DC | PRN

## 2022-06-24 MED ORDER — INSULIN ASPART 100 UNIT/ML IJ SOLN
0.0000 [IU] | Freq: Every day | INTRAMUSCULAR | Status: DC
  Administered 2022-06-25: 2 [IU] via SUBCUTANEOUS

## 2022-06-24 MED ORDER — ACETAMINOPHEN 325 MG PO TABS
650.0000 mg | ORAL_TABLET | Freq: Once | ORAL | Status: AC
  Administered 2022-06-24: 650 mg via ORAL
  Filled 2022-06-24: qty 2

## 2022-06-24 MED ORDER — INSULIN ASPART 100 UNIT/ML IJ SOLN
0.0000 [IU] | Freq: Three times a day (TID) | INTRAMUSCULAR | Status: DC
  Administered 2022-06-24: 5 [IU] via SUBCUTANEOUS
  Administered 2022-06-25: 3 [IU] via SUBCUTANEOUS
  Administered 2022-06-26: 5 [IU] via SUBCUTANEOUS

## 2022-06-24 MED ORDER — SODIUM CHLORIDE 0.9 % IV SOLN
2.0000 g | INTRAVENOUS | Status: DC
  Administered 2022-06-25 – 2022-06-26 (×2): 2 g via INTRAVENOUS
  Filled 2022-06-24 (×2): qty 20

## 2022-06-24 MED ORDER — POTASSIUM CHLORIDE CRYS ER 20 MEQ PO TBCR
40.0000 meq | EXTENDED_RELEASE_TABLET | Freq: Every day | ORAL | Status: AC
  Administered 2022-06-24 – 2022-06-25 (×2): 40 meq via ORAL
  Filled 2022-06-24 (×2): qty 2

## 2022-06-24 MED ORDER — LACTATED RINGERS IV BOLUS (SEPSIS)
1000.0000 mL | Freq: Once | INTRAVENOUS | Status: AC
  Administered 2022-06-24: 1000 mL via INTRAVENOUS

## 2022-06-25 DIAGNOSIS — N179 Acute kidney failure, unspecified: Secondary | ICD-10-CM | POA: Diagnosis not present

## 2022-06-25 LAB — GLUCOSE, CAPILLARY
Glucose-Capillary: 119 mg/dL — ABNORMAL HIGH (ref 70–99)
Glucose-Capillary: 194 mg/dL — ABNORMAL HIGH (ref 70–99)
Glucose-Capillary: 124 mg/dL — ABNORMAL HIGH (ref 70–99)
Glucose-Capillary: 207 mg/dL — ABNORMAL HIGH (ref 70–99)

## 2022-06-25 LAB — CBC
HCT: 36.2 % — ABNORMAL LOW (ref 39.0–52.0)
Hemoglobin: 12.4 g/dL — ABNORMAL LOW (ref 13.0–17.0)
MCH: 30.8 pg (ref 26.0–34.0)
MCHC: 34.3 g/dL (ref 30.0–36.0)
MCV: 90 fL (ref 80.0–100.0)
Platelets: 303 10*3/uL (ref 150–400)
RBC: 4.02 MIL/uL — ABNORMAL LOW (ref 4.22–5.81)
RDW: 13.7 % (ref 11.5–15.5)
WBC: 15.8 10*3/uL — ABNORMAL HIGH (ref 4.0–10.5)
nRBC: 0 % (ref 0.0–0.2)

## 2022-06-25 LAB — COMPREHENSIVE METABOLIC PANEL
ALT: 86 U/L — ABNORMAL HIGH (ref 0–44)
AST: 109 U/L — ABNORMAL HIGH (ref 15–41)
Albumin: 2.8 g/dL — ABNORMAL LOW (ref 3.5–5.0)
Alkaline Phosphatase: 80 U/L (ref 38–126)
Anion gap: 8 (ref 5–15)
BUN: 24 mg/dL — ABNORMAL HIGH (ref 8–23)
CO2: 25 mmol/L (ref 22–32)
Calcium: 8.5 mg/dL — ABNORMAL LOW (ref 8.9–10.3)
Chloride: 103 mmol/L (ref 98–111)
Creatinine, Ser: 1.39 mg/dL — ABNORMAL HIGH (ref 0.61–1.24)
GFR, Estimated: 55 mL/min — ABNORMAL LOW (ref >60)
Glucose, Bld: 122 mg/dL — ABNORMAL HIGH (ref 70–99)
Potassium: 4 mmol/L (ref 3.5–5.1)
Sodium: 136 mmol/L (ref 135–145)
Total Bilirubin: 1.2 mg/dL (ref 0.3–1.2)
Total Protein: 6.7 g/dL (ref 6.5–8.1)

## 2022-06-25 LAB — URINE CULTURE: Culture: <10000 — AB

## 2022-06-25 LAB — PROTIME-INR
INR: 1.2 (ref 0.8–1.2)
Prothrombin Time: 14.7 seconds (ref 11.4–15.2)

## 2022-06-25 LAB — HEMOGLOBIN A1C
Hgb A1c MFr Bld: 6.5 % — ABNORMAL HIGH (ref 4.8–5.6)
Mean Plasma Glucose: 139.85 mg/dL

## 2022-06-25 LAB — CORTISOL-AM, BLOOD: Cortisol - AM: 20.4 ug/dL (ref 6.7–22.6)

## 2022-06-25 LAB — PROCALCITONIN: Procalcitonin: 3.23 ng/mL

## 2022-06-25 LAB — HIV ANTIBODY (ROUTINE TESTING W REFLEX): HIV Screen 4th Generation wRfx: NONREACTIVE

## 2022-06-26 DIAGNOSIS — N179 Acute kidney failure, unspecified: Secondary | ICD-10-CM | POA: Diagnosis not present

## 2022-06-26 LAB — CBC WITH DIFFERENTIAL/PLATELET
Abs Immature Granulocytes: 0.11 10*3/uL — ABNORMAL HIGH (ref 0.00–0.07)
Basophils Absolute: 0.1 10*3/uL (ref 0.0–0.1)
Basophils Relative: 1 %
Eosinophils Absolute: 0.5 10*3/uL (ref 0.0–0.5)
Eosinophils Relative: 3 %
HCT: 33.6 % — ABNORMAL LOW (ref 39.0–52.0)
Hemoglobin: 11.4 g/dL — ABNORMAL LOW (ref 13.0–17.0)
Immature Granulocytes: 1 %
Lymphocytes Relative: 13 %
Lymphs Abs: 1.9 10*3/uL (ref 0.7–4.0)
MCH: 30.4 pg (ref 26.0–34.0)
MCHC: 33.9 g/dL (ref 30.0–36.0)
MCV: 89.6 fL (ref 80.0–100.0)
Monocytes Absolute: 1.8 10*3/uL — ABNORMAL HIGH (ref 0.1–1.0)
Monocytes Relative: 12 %
Neutro Abs: 10.3 10*3/uL — ABNORMAL HIGH (ref 1.7–7.7)
Neutrophils Relative %: 70 %
Platelets: 301 10*3/uL (ref 150–400)
RBC: 3.75 MIL/uL — ABNORMAL LOW (ref 4.22–5.81)
RDW: 13.9 % (ref 11.5–15.5)
WBC: 14.6 10*3/uL — ABNORMAL HIGH (ref 4.0–10.5)
nRBC: 0 % (ref 0.0–0.2)

## 2022-06-26 LAB — COMPREHENSIVE METABOLIC PANEL
ALT: 80 U/L — ABNORMAL HIGH (ref 0–44)
AST: 71 U/L — ABNORMAL HIGH (ref 15–41)
Albumin: 2.7 g/dL — ABNORMAL LOW (ref 3.5–5.0)
Alkaline Phosphatase: 89 U/L (ref 38–126)
Anion gap: 6 (ref 5–15)
BUN: 16 mg/dL (ref 8–23)
CO2: 26 mmol/L (ref 22–32)
Calcium: 8.7 mg/dL — ABNORMAL LOW (ref 8.9–10.3)
Chloride: 109 mmol/L (ref 98–111)
Creatinine, Ser: 1.3 mg/dL — ABNORMAL HIGH (ref 0.61–1.24)
GFR, Estimated: 59 mL/min — ABNORMAL LOW (ref >60)
Glucose, Bld: 145 mg/dL — ABNORMAL HIGH (ref 70–99)
Potassium: 4.5 mmol/L (ref 3.5–5.1)
Sodium: 141 mmol/L (ref 135–145)
Total Bilirubin: 1 mg/dL (ref 0.3–1.2)
Total Protein: 6.5 g/dL (ref 6.5–8.1)

## 2022-06-26 LAB — GLUCOSE, CAPILLARY: Glucose-Capillary: 209 mg/dL — ABNORMAL HIGH (ref 70–99)

## 2022-06-29 LAB — CULTURE, BLOOD (ROUTINE X 2)
Culture: NO GROWTH
Culture: NO GROWTH
Special Requests: ADEQUATE
Special Requests: ADEQUATE

## 2022-07-29 ENCOUNTER — Emergency Department (HOSPITAL_BASED_OUTPATIENT_CLINIC_OR_DEPARTMENT_OTHER): Payer: Medicare (Managed Care)

## 2022-07-29 ENCOUNTER — Other Ambulatory Visit: Payer: Self-pay

## 2022-07-29 ENCOUNTER — Emergency Department (HOSPITAL_BASED_OUTPATIENT_CLINIC_OR_DEPARTMENT_OTHER)
Admission: EM | Admit: 2022-07-29 | Discharge: 2022-07-29 | Disposition: A | Discharge status: Home / Self Care | Payer: Medicare (Managed Care) | Attending: Emergency Medicine | Admitting: Emergency Medicine

## 2022-07-29 ENCOUNTER — Encounter (HOSPITAL_BASED_OUTPATIENT_CLINIC_OR_DEPARTMENT_OTHER): Payer: Self-pay | Admitting: Emergency Medicine

## 2022-07-29 DIAGNOSIS — Z79899 Other long term (current) drug therapy: Secondary | ICD-10-CM | POA: Diagnosis not present

## 2022-07-29 DIAGNOSIS — Z7982 Long term (current) use of aspirin: Secondary | ICD-10-CM | POA: Insufficient documentation

## 2022-07-29 DIAGNOSIS — M109 Gout, unspecified: Secondary | ICD-10-CM | POA: Insufficient documentation

## 2022-07-29 DIAGNOSIS — M7989 Other specified soft tissue disorders: Secondary | ICD-10-CM | POA: Diagnosis present

## 2022-07-29 MED ORDER — PREDNISONE 50 MG PO TABS
50.0000 mg | ORAL_TABLET | Freq: Once | ORAL | Status: AC
  Administered 2022-07-29: 50 mg via ORAL
  Filled 2022-07-29: qty 1

## 2022-07-29 MED ORDER — INDOMETHACIN 25 MG PO CAPS
25.0000 mg | ORAL_CAPSULE | Freq: Three times a day (TID) | ORAL | 0 refills | Status: AC | PRN
Start: 2022-07-29 — End: ?

## 2022-07-29 MED ORDER — INDOMETHACIN 25 MG PO CAPS
50.0000 mg | ORAL_CAPSULE | Freq: Once | ORAL | Status: AC
  Administered 2022-07-29: 50 mg via ORAL
  Filled 2022-07-29: qty 2

## 2022-07-29 MED ORDER — PREDNISONE 10 MG (21) PO TBPK: ORAL_TABLET | Freq: Every day | ORAL | 0 refills | Status: AC

## 2022-07-29 NOTE — ED Notes (Signed)
Pt verbalized understanding of MSE process. Signature pad not working at this time.

## 2022-07-29 NOTE — ED Triage Notes (Signed)
L wrist pain and swelling since Wednesday. Pt states he worked out and then tried to open something that was difficult and then he had pain. He states pain has worsened over time and ROM is limited.

## 2022-07-29 NOTE — Discharge Instructions (Addendum)
Read the information about gout attacks to these discharge papers.  I have also attached information about foods that may trigger this.  The doctor on this paperwork specializes in hands.  You may follow-up with if you want further evaluation.  Return with any worsening symptoms.

## 2022-07-29 NOTE — ED Notes (Signed)
Pt c/o wrist pain past few days

## 2022-07-29 NOTE — ED Provider Notes (Signed)
[facility] EMERGENCY DEPARTMENT Provider Note   CSN: [id number] Arrival date & time: 07/29/22  2117     History  Chief Complaint  Patient presents with   Wrist Pain    Hayden Kemp is a 72 y.o. male presenting with pain and swelling to the left wrist.  Says it started on Wednesday.  Has taken Aleve which has helped him but he does not want to "mask his symptoms."  No abrasions, lacerations or bug bites.  History of well-controlled diabetes.   Wrist Pain      Home Medications Prior to Admission medications   Medication Sig Start Date End Date Taking? Authorizing Provider  indomethacin (INDOCIN) 25 MG capsule Take 1 capsule (25 mg total) by mouth 3 (three) times daily as needed. 07/29/22  Yes Ceasia Elwell A, PA-C  predniSONE (STERAPRED UNI-PAK 21 TAB) 10 MG (21) TBPK tablet Take by mouth daily. Take 6 tabs by mouth daily  for 2 days, then 5 tabs for 2 days, then 4 tabs for 2 days, then 3 tabs for 2 days, 2 tabs for 2 days, then 1 tab by mouth daily for 2 days 07/29/22  Yes Renae Mottley A, PA-C  acetaminophen (TYLENOL) 325 MG tablet Take 2 tablets (650 mg total) by mouth every 6 (six) hours as needed for mild pain or moderate pain. Patient not taking: Reported on 06/24/2022 04/29/17   Mack Hook, MD  aspirin EC 81 MG tablet Take 81 mg by mouth daily.    [provider]  glipiZIDE (GLUCOTROL XL) 5 MG 24 hr tablet Take 5 mg by mouth daily with breakfast.    [provider]  losartan-hydrochlorothiazide (HYZAAR) 100-12.5 MG tablet Take 1 tablet by mouth daily.    [provider]  metFORMIN (GLUCOPHAGE) 500 MG tablet Take 500 mg by mouth daily with breakfast.    [provider]  Multiple Vitamin (MULTIVITAMIN WITH MINERALS) TABS tablet Take 1 tablet by mouth daily.    [provider]  omega-3 acid ethyl esters (LOVAZA) 1 g capsule Take 1 g by mouth daily.    [provider]      Allergies    Pollen extract     Review of Systems   Review of Systems  Physical Exam Updated Vital Signs BP 120/79 (BP Location: Right Arm)   Pulse 97   Temp 98.6 F (37 C) (Oral)   Resp 19   Ht 6' (1.829 m)   Wt 108 kg   SpO2 95%   BMI 32.28 kg/m  Physical Exam Vitals and nursing note reviewed.  Constitutional:      Appearance: Normal appearance.  HENT:     Head: Normocephalic and atraumatic.  Eyes:     General: No scleral icterus.    Conjunctiva/sclera: Conjunctivae normal.  Cardiovascular:     Pulses: Normal pulses.  Pulmonary:     Effort: Pulmonary effort is normal. No respiratory distress.  Skin:    Capillary Refill: Capillary refill takes less than 2 seconds.     Findings: No rash.     Comments: Warmth and swelling to the left wrist  Neurological:     Mental Status: He is alert.  Psychiatric:        Mood and Affect: Mood normal.     ED Results / Procedures / Treatments   Labs (all labs ordered are listed, but only abnormal results are displayed) Labs Reviewed - No data to display  EKG None  Radiology DG Wrist Complete Left  Result Date: 07/29/2022 CLINICAL DATA:  Nontraumatic left wrist pain and swelling for 1 day. EXAM: LEFT WRIST - COMPLETE 3+ VIEW COMPARISON:  None Available. FINDINGS: Normal bone mineralization. Old well corticated ossicle just distal to the ulnar styloid, likely remote nonunited fracture. Joint spaces are preserved. Mild peripheral scapholunate degenerative osteophytes. 9 mm cystic lucency within the proximal aspect of the capitate may be developmental. No acute fracture is seen. No dislocation. IMPRESSION: No acute fracture. Likely remote distal ulnar styloid fracture. Electronically Signed   By: Neita Garnet M.D.   On: 07/29/2022 21:42    Procedures Procedures   Medications Ordered in ED Medications  indomethacin (INDOCIN) capsule 50 mg (50 mg Oral Given 07/29/22 2215)  predniSONE (DELTASONE) tablet 50 mg (50 mg Oral Given 07/29/22 2215)    ED Course/  Medical Decision Making/ A&P                           Medical Decision Making Amount and/or Complexity of Data Reviewed Radiology: ordered.  Risk Prescription drug management.   72 year old male presenting today with atraumatic wrist pain.  Differential includes but is not limited to septic joint, avascular necrosis, fracture, rheumatologic disease and gout/pseudogout  Physical exam: Swelling and tenderness to the left wrist.  Neurovascularly intact.  Patient is afebrile and not tachycardic.  No signs of sepsis.  Appears more consistent with gout/pseudogout.  No arthrocentesis indicated  Treatment: Given dose of indomethacin and steroids for a presumed gout flare.  X-ray ordered, reviewed and individually interpreted by me.  Negative for fx, effusion or other abnormalities.   MDM/disposition: When I spoke with the patient about this likely being gout he tells me that he is always told he has gout he does not believe he does.  He is neurovascularly intact, no signs of sepsis, full range of motion of the wrist.  He will be started on indomethacin and prednisone taper.  He was given a hand surgery referral as well.  He is agreeable to discharge at this time   Final Clinical Impression(s) / ED Diagnoses Final diagnoses:  Acute gout of left hand, unspecified cause    Rx / DC Orders ED Discharge Orders          Ordered    predniSONE (STERAPRED UNI-PAK 21 TAB) 10 MG (21) TBPK tablet  Daily        07/29/22 2208    indomethacin (INDOCIN) 25 MG capsule  3 times daily PRN        07/29/22 2208           Results and diagnoses were explained to the patient. Return precautions discussed in full. Patient had no additional questions and expressed complete understanding.   This chart was dictated using voice recognition software.  Despite best efforts to proofread,  errors can occur which can change the documentation meaning.    Saddie Benders, PA-C 07/29/22 2319    Sloan Leiter, DO 07/31/22 0003

## 2022-08-02 ENCOUNTER — Emergency Department (HOSPITAL_BASED_OUTPATIENT_CLINIC_OR_DEPARTMENT_OTHER)
Admission: EM | Admit: 2022-08-02 | Discharge: 2022-08-02 | Disposition: A | Discharge status: Home / Self Care | Payer: Medicare (Managed Care) | Attending: Emergency Medicine | Admitting: Emergency Medicine

## 2022-08-02 ENCOUNTER — Encounter (HOSPITAL_BASED_OUTPATIENT_CLINIC_OR_DEPARTMENT_OTHER): Payer: Self-pay | Admitting: Emergency Medicine

## 2022-08-02 ENCOUNTER — Other Ambulatory Visit: Payer: Self-pay

## 2022-08-02 DIAGNOSIS — T161XXA Foreign body in right ear, initial encounter: Secondary | ICD-10-CM | POA: Diagnosis not present

## 2022-08-02 DIAGNOSIS — X58XXXA Exposure to other specified factors, initial encounter: Secondary | ICD-10-CM | POA: Diagnosis not present

## 2022-08-02 DIAGNOSIS — H65191 Other acute nonsuppurative otitis media, right ear: Secondary | ICD-10-CM

## 2022-08-02 DIAGNOSIS — Z7984 Long term (current) use of oral hypoglycemic drugs: Secondary | ICD-10-CM | POA: Insufficient documentation

## 2022-08-02 DIAGNOSIS — H9221 Otorrhagia, right ear: Secondary | ICD-10-CM | POA: Insufficient documentation

## 2022-08-02 DIAGNOSIS — I10 Essential (primary) hypertension: Secondary | ICD-10-CM | POA: Insufficient documentation

## 2022-08-02 DIAGNOSIS — Z79899 Other long term (current) drug therapy: Secondary | ICD-10-CM | POA: Diagnosis not present

## 2022-08-02 DIAGNOSIS — E119 Type 2 diabetes mellitus without complications: Secondary | ICD-10-CM | POA: Diagnosis not present

## 2022-08-02 DIAGNOSIS — Z7982 Long term (current) use of aspirin: Secondary | ICD-10-CM | POA: Diagnosis not present

## 2022-08-02 MED ORDER — CETIRIZINE HCL 10 MG PO TABS: 10.0000 mg | ORAL_TABLET | Freq: Every day | ORAL | 0 refills | Status: AC

## 2022-08-02 MED ORDER — FLUTICASONE PROPIONATE 50 MCG/ACT NA SUSP: 1.0000 | Freq: Every day | NASAL | 0 refills | Status: AC

## 2022-08-02 NOTE — ED Provider Notes (Signed)
[facility] EMERGENCY DEPARTMENT Provider Note   CSN: [id number] Arrival date & time: 08/02/22  1744     History  Chief Complaint  Patient presents with   Foreign Body in Ear    Hayden Kemp is a 72 y.o. male.  Patient is a 72 year old male with a history of diabetes and hypertension who is presenting today with complaint of feeling like there is something in his right ear.  He reports 2 days ago he was working out in the yard and he thought something got in it.  He has had intermittent feeling of fullness and decreased hearing in the right ear.  He feels like there is something in there.  He tried sweet oil but it did not help.  The history is provided by the patient.  Foreign Body in Ear       Home Medications Prior to Admission medications   Medication Sig Start Date End Date Taking? Authorizing Provider  cetirizine (ZYRTEC ALLERGY) 10 MG tablet Take 1 tablet (10 mg total) by mouth daily. 08/02/22  Yes Zakirah Weingart, Alphonzo Lemmings, MD  fluticasone (FLONASE) 50 MCG/ACT nasal spray Place 1 spray into both nostrils daily for 14 days. 08/02/22 08/16/22 Yes Gwyneth Sprout, MD  acetaminophen (TYLENOL) 325 MG tablet Take 2 tablets (650 mg total) by mouth every 6 (six) hours as needed for mild pain or moderate pain. Patient not taking: Reported on 06/24/2022 04/29/17   Mack Hook, MD  aspirin EC 81 MG tablet Take 81 mg by mouth daily.    [provider]  glipiZIDE (GLUCOTROL XL) 5 MG 24 hr tablet Take 5 mg by mouth daily with breakfast.    [provider]  indomethacin (INDOCIN) 25 MG capsule Take 1 capsule (25 mg total) by mouth 3 (three) times daily as needed. 07/29/22   Redwine, Madison A, PA-C  losartan-hydrochlorothiazide (HYZAAR) 100-12.5 MG tablet Take 1 tablet by mouth daily.    [provider]  metFORMIN (GLUCOPHAGE) 500 MG tablet Take 500 mg by mouth daily with breakfast.    [provider]  Multiple Vitamin (MULTIVITAMIN WITH MINERALS)  TABS tablet Take 1 tablet by mouth daily.    [provider]  omega-3 acid ethyl esters (LOVAZA) 1 g capsule Take 1 g by mouth daily.    [provider]  predniSONE (STERAPRED UNI-PAK 21 TAB) 10 MG (21) TBPK tablet Take by mouth daily. Take 6 tabs by mouth daily  for 2 days, then 5 tabs for 2 days, then 4 tabs for 2 days, then 3 tabs for 2 days, 2 tabs for 2 days, then 1 tab by mouth daily for 2 days 07/29/22   Redwine, Madison A, PA-C      Allergies    Pollen extract    Review of Systems   Review of Systems  Physical Exam Updated Vital Signs BP 119/83 (BP Location: Left Arm)   Pulse 94   Temp 97.8 F (36.6 C) (Oral)   Resp 18   Ht 6' (1.829 m)   Wt 108 kg   SpO2 97%   BMI 32.29 kg/m  Physical Exam Vitals and nursing note reviewed.  HENT:     Head:     Comments: Minimal wax present in the right ear but nothing else in the ear canal.  Moderate amount of fluid behind the TM    Left Ear: Tympanic membrane normal.     Nose: Nose normal. No congestion or rhinorrhea.     Mouth/Throat:  Mouth: Mucous membranes are moist.  Eyes:     Extraocular Movements: Extraocular movements intact.     Pupils: Pupils are equal, round, and reactive to light.  Cardiovascular:     Rate and Rhythm: Normal rate.  Skin:    General: Skin is warm.  Neurological:     Mental Status: He is alert and oriented to person, place, and time. Mental status is at baseline.  Psychiatric:        Mood and Affect: Mood normal.     ED Results / Procedures / Treatments   Labs (all labs ordered are listed, but only abnormal results are displayed) Labs Reviewed - No data to display  EKG None  Radiology No results found.  Procedures Procedures    Medications Ordered in ED Medications - No data to display  ED Course/ Medical Decision Making/ A&P                           Medical Decision Making Risk OTC drugs.   Patient here complaining of a foreign body sensation in his  right ear.  He continually shakes his head and states there is something in there.  On inspection patient has nothing significant in his ear canal.  There are no perforations of his TM.  There is some fluid behind his ear but no other acute findings.  Patient feels that there is something in his ear but tried to explain to him that we would see it if there was something in his ear.  Flushed his ear just in case and got the small piece of wax that was seen but otherwise it is normal.  Discussed with patient using antihistamines and nasal sprays to see if we can get the fluid out from behind his ear.  Encouraged him to follow-up with his PCP if its not better in a week.        Final Clinical Impression(s) / ED Diagnoses Final diagnoses:  Acute effusion of right ear    Rx / DC Orders ED Discharge Orders          Ordered    fluticasone (FLONASE) 50 MCG/ACT nasal spray  Daily        08/02/22 1838    cetirizine (ZYRTEC ALLERGY) 10 MG tablet  Daily        08/02/22 Forestine Chute, MD 08/02/22 1840

## 2022-08-02 NOTE — ED Triage Notes (Signed)
Pt is concerned something fell into his right ear while working out in the yard today.

## 2022-08-02 NOTE — ED Notes (Signed)
Discharge instructions  and recommendations reviewed with patient. Questions answered. States understanding. Ambulatory upon discharge

## 2022-08-02 NOTE — Discharge Instructions (Signed)
You have some fluid behind your ear drum that is causing the sensation you are feeling.  You can use the nasal spray and the antihistamine for the next 2 weeks and should help dry it up.

## 2023-01-14 ENCOUNTER — Emergency Department (HOSPITAL_BASED_OUTPATIENT_CLINIC_OR_DEPARTMENT_OTHER): Payer: Medicare HMO

## 2023-01-14 ENCOUNTER — Emergency Department (HOSPITAL_BASED_OUTPATIENT_CLINIC_OR_DEPARTMENT_OTHER)
Admission: EM | Admit: 2023-01-14 | Discharge: 2023-01-14 | Disposition: A | Discharge status: Home / Self Care | Payer: Medicare HMO | Attending: Emergency Medicine | Admitting: Emergency Medicine

## 2023-01-14 ENCOUNTER — Other Ambulatory Visit: Payer: Self-pay

## 2023-01-14 ENCOUNTER — Encounter (HOSPITAL_BASED_OUTPATIENT_CLINIC_OR_DEPARTMENT_OTHER): Payer: Self-pay | Admitting: Emergency Medicine

## 2023-01-14 DIAGNOSIS — U071 COVID-19: Secondary | ICD-10-CM | POA: Diagnosis not present

## 2023-01-14 DIAGNOSIS — Z7982 Long term (current) use of aspirin: Secondary | ICD-10-CM | POA: Insufficient documentation

## 2023-01-14 DIAGNOSIS — R059 Cough, unspecified: Secondary | ICD-10-CM | POA: Diagnosis present

## 2023-01-14 LAB — RESP PANEL BY RT-PCR (RSV, FLU A&B, COVID)  RVPGX2
Influenza A by PCR: NEGATIVE
Influenza B by PCR: NEGATIVE
Resp Syncytial Virus by PCR: NEGATIVE
SARS Coronavirus 2 by RT PCR: POSITIVE — AB

## 2023-01-14 MED ORDER — BENZONATATE 100 MG PO CAPS: 100.0000 mg | ORAL_CAPSULE | Freq: Three times a day (TID) | ORAL | 0 refills | Status: AC

## 2023-01-14 NOTE — ED Triage Notes (Signed)
Dry cough x 3 weeks , no fever , denies shortness of breath . Mid chest pain with strong cough . No relief despite OTC cough meds .

## 2023-01-14 NOTE — ED Provider Notes (Signed)
[facility] EMERGENCY DEPARTMENT Provider Note   CSN: [id number] Arrival date & time: 01/14/23  0947     History  Chief Complaint  Patient presents with   Cough    Hayden BROTHERS is a 73 y.o. male.  Patient is a 73 year old male who presents with cough and cold symptoms for the last 3 weeks.  He says now its mostly just dry cough.  Occasionally feels some chest congestion.  He does not report any actual shortness of breath but he feels his breathing does not feel 100% normal.  No associated chest pain other than some pain in the center of his chest with coughing.  No fevers.  No vomiting or diarrhea.  He says overall his symptoms have not gotten any worse and sometimes they feel like they are getting better but today the cough seemed little worse so he came in.       Home Medications Prior to Admission medications   Medication Sig Start Date End Date Taking? Authorizing Provider  benzonatate (TESSALON) 100 MG capsule Take 1 capsule (100 mg total) by mouth every 8 (eight) hours. 01/14/23  Yes Rolan Bucco, MD  acetaminophen (TYLENOL) 325 MG tablet Take 2 tablets (650 mg total) by mouth every 6 (six) hours as needed for mild pain or moderate pain. Patient not taking: Reported on 06/24/2022 04/29/17   Mack Hook, MD  aspirin EC 81 MG tablet Take 81 mg by mouth daily.    [provider]  cetirizine (ZYRTEC ALLERGY) 10 MG tablet Take 1 tablet (10 mg total) by mouth daily. 08/02/22   Gwyneth Sprout, MD  fluticasone (FLONASE) 50 MCG/ACT nasal spray Place 1 spray into both nostrils daily for 14 days. 08/02/22 08/16/22  Gwyneth Sprout, MD  glipiZIDE (GLUCOTROL XL) 5 MG 24 hr tablet Take 5 mg by mouth daily with breakfast.    [provider]  indomethacin (INDOCIN) 25 MG capsule Take 1 capsule (25 mg total) by mouth 3 (three) times daily as needed. 07/29/22   Redwine, Madison A, PA-C  losartan-hydrochlorothiazide (HYZAAR) 100-12.5 MG tablet Take 1 tablet by  mouth daily.    [provider]  metFORMIN (GLUCOPHAGE) 500 MG tablet Take 500 mg by mouth daily with breakfast.    [provider]  Multiple Vitamin (MULTIVITAMIN WITH MINERALS) TABS tablet Take 1 tablet by mouth daily.    [provider]  omega-3 acid ethyl esters (LOVAZA) 1 g capsule Take 1 g by mouth daily.    [provider]  predniSONE (STERAPRED UNI-PAK 21 TAB) 10 MG (21) TBPK tablet Take by mouth daily. Take 6 tabs by mouth daily  for 2 days, then 5 tabs for 2 days, then 4 tabs for 2 days, then 3 tabs for 2 days, 2 tabs for 2 days, then 1 tab by mouth daily for 2 days 07/29/22   Redwine, Madison A, PA-C      Allergies    Pollen extract    Review of Systems   Review of Systems  Constitutional:  Positive for fatigue. Negative for chills, diaphoresis and fever.  HENT:  Positive for congestion. Negative for rhinorrhea and sneezing.   Eyes: Negative.   Respiratory:  Positive for cough and shortness of breath. Negative for chest tightness.   Cardiovascular:  Negative for chest pain and leg swelling.  Gastrointestinal:  Negative for abdominal pain, blood in stool, diarrhea, nausea and vomiting.  Genitourinary:  Negative for difficulty urinating, flank pain, frequency and hematuria.  Musculoskeletal:  Negative  for arthralgias and back pain.  Skin:  Negative for rash.  Neurological:  Negative for dizziness, speech difficulty, weakness, numbness and headaches.    Physical Exam Updated Vital Signs BP 132/81   Pulse 81   Temp 98.1 F (36.7 C)   Resp 20   Ht 6' (1.829 m)   Wt 108 kg   SpO2 99%   BMI 32.28 kg/m  Physical Exam Constitutional:      Appearance: He is well-developed.  HENT:     Head: Normocephalic and atraumatic.  Eyes:     Pupils: Pupils are equal, round, and reactive to light.  Cardiovascular:     Rate and Rhythm: Normal rate and regular rhythm.     Heart sounds: Normal heart sounds.  Pulmonary:     Effort: Pulmonary effort is  normal. No respiratory distress.     Breath sounds: Normal breath sounds. No wheezing or rales.  Chest:     Chest wall: No tenderness.  Abdominal:     General: Bowel sounds are normal.     Palpations: Abdomen is soft.     Tenderness: There is no abdominal tenderness. There is no guarding or rebound.  Musculoskeletal:        General: Normal range of motion.     Cervical back: Normal range of motion and neck supple.     Comments: No edema or calf tenderness  Lymphadenopathy:     Cervical: No cervical adenopathy.  Skin:    General: Skin is warm and dry.     Findings: No rash.  Neurological:     Mental Status: He is alert and oriented to person, place, and time.     ED Results / Procedures / Treatments   Labs (all labs ordered are listed, but only abnormal results are displayed) Labs Reviewed  RESP PANEL BY RT-PCR (RSV, FLU A&B, COVID)  [id number] - Abnormal; Notable for the following components:      Result Value   SARS Coronavirus 2 by RT PCR POSITIVE (*)    All other components within normal limits    EKG EKG Interpretation  Date/Time:  Monday January 14 2023 10:02:27 EST Ventricular Rate:  84 PR Interval:  180 QRS Duration: 97 QT Interval:  372 QTC Calculation: 440 R Axis:   66 Text Interpretation: Sinus rhythm since last tracing no significant change Confirmed by Malvin Johns [phone]) on 01/14/2023 11:19:13 AM  Radiology DG Chest 2 View  Result Date: 01/14/2023 CLINICAL DATA:  73 year old male with nonproductive cough for 3 weeks. Chest pain with strong cough. EXAM: CHEST - 2 VIEW COMPARISON:  Chest radiograph 06/24/2022 and earlier. FINDINGS: Lung volumes and mediastinal contours are stable, within normal limits. Visualized tracheal air column is within normal limits. Lung markings appear unchanged from last year. No pneumothorax, pulmonary edema, pleural effusion or acute pulmonary opacity. No acute osseous abnormality identified. Negative visible bowel gas. IMPRESSION:  No acute cardiopulmonary abnormality. Electronically Signed   By: Genevie Ann M.D.   On: 01/14/2023 10:17    Procedures Procedures    Medications Ordered in ED Medications - No data to display  ED Course/ Medical Decision Making/ A&P                             Medical Decision Making Problems Addressed: COVID-19 virus infection: acute illness or injury with systemic symptoms  Amount and/or Complexity of Data Reviewed External Data Reviewed: notes. Labs: ordered. Decision-making details documented in  ED Course. Radiology: ordered and independent interpretation performed. Decision-making details documented in ED Course. ECG/medicine tests: ordered and independent interpretation performed. Decision-making details documented in ED Course.  Risk Prescription drug management. Decision regarding hospitalization.   Patient with a 3-week history of cough and cold symptoms.  Chest x-ray was performed which was interpreted by me and confirmed by the radiologist to show no evidence of pneumonia.  No pulmonary edema.  No pneumothorax.  His COVID test is positive.  This likely explains his symptoms.  He does not have any suggestions of ACS.  No other symptoms that would be more concerning for PE.  No evidence of pneumonia.  No evidence of fluid overload.  He is overall well-appearing and appropriate for discharge.  He was discharged home in good condition.  He is out of the window for antiviral treatment.  He was given a prescription for [employer]  Other symptomatic care instructions were given.  Return precautions were given.  Final Clinical Impression(s) / ED Diagnoses Final diagnoses:  COVID-19 virus infection    Rx / DC Orders ED Discharge Orders          Ordered    benzonatate (TESSALON) 100 MG capsule  Every 8 hours        01/14/23 1136              Malvin Johns, MD 01/14/23 1140
